# Patient Record
Sex: Female | Born: 1980 | Race: White | Hispanic: No | Marital: Single | Smoking: Never smoker
Health system: Southern US, Community
[De-identification: ages and names within clinical notes are randomized; demographics above are authoritative.]

## PROBLEM LIST (undated history)

## (undated) DIAGNOSIS — K219 Gastro-esophageal reflux disease without esophagitis: Secondary | ICD-10-CM

## (undated) DIAGNOSIS — E039 Hypothyroidism, unspecified: Secondary | ICD-10-CM

## (undated) DIAGNOSIS — F419 Anxiety disorder, unspecified: Secondary | ICD-10-CM

## (undated) DIAGNOSIS — E230 Hypopituitarism: Secondary | ICD-10-CM

## (undated) DIAGNOSIS — F79 Unspecified intellectual disabilities: Secondary | ICD-10-CM

## (undated) DIAGNOSIS — I8290 Acute embolism and thrombosis of unspecified vein: Secondary | ICD-10-CM

## (undated) DIAGNOSIS — D496 Neoplasm of unspecified behavior of brain: Secondary | ICD-10-CM

## (undated) DIAGNOSIS — R569 Unspecified convulsions: Secondary | ICD-10-CM

## (undated) DIAGNOSIS — E119 Type 2 diabetes mellitus without complications: Secondary | ICD-10-CM

## (undated) HISTORY — PX: SHUNT REPLACEMENT: SHX5403

---

## 2013-11-12 DIAGNOSIS — R443 Hallucinations, unspecified: Secondary | ICD-10-CM | POA: Insufficient documentation

## 2013-11-12 DIAGNOSIS — D496 Neoplasm of unspecified behavior of brain: Secondary | ICD-10-CM | POA: Insufficient documentation

## 2013-11-12 DIAGNOSIS — F79 Unspecified intellectual disabilities: Secondary | ICD-10-CM | POA: Insufficient documentation

## 2013-11-12 DIAGNOSIS — G40209 Localization-related (focal) (partial) symptomatic epilepsy and epileptic syndromes with complex partial seizures, not intractable, without status epilepticus: Secondary | ICD-10-CM | POA: Insufficient documentation

## 2015-01-30 ENCOUNTER — Emergency Department (HOSPITAL_COMMUNITY): Payer: Medicare Other

## 2015-01-30 ENCOUNTER — Inpatient Hospital Stay (HOSPITAL_COMMUNITY): Payer: Medicare Other

## 2015-01-30 ENCOUNTER — Encounter (HOSPITAL_COMMUNITY): Payer: Self-pay | Admitting: Emergency Medicine

## 2015-01-30 ENCOUNTER — Inpatient Hospital Stay (HOSPITAL_COMMUNITY)
Admission: EM | Admit: 2015-01-30 | Discharge: 2015-02-08 | DRG: 871 | Disposition: A | Discharge status: Skilled Nursing Facility | Payer: Medicare Other | Attending: Internal Medicine | Admitting: Internal Medicine

## 2015-01-30 DIAGNOSIS — E871 Hypo-osmolality and hyponatremia: Secondary | ICD-10-CM | POA: Diagnosis present

## 2015-01-30 DIAGNOSIS — F79 Unspecified intellectual disabilities: Secondary | ICD-10-CM | POA: Diagnosis present

## 2015-01-30 DIAGNOSIS — A419 Sepsis, unspecified organism: Secondary | ICD-10-CM | POA: Diagnosis present

## 2015-01-30 DIAGNOSIS — F419 Anxiety disorder, unspecified: Secondary | ICD-10-CM | POA: Diagnosis present

## 2015-01-30 DIAGNOSIS — E2740 Unspecified adrenocortical insufficiency: Secondary | ICD-10-CM

## 2015-01-30 DIAGNOSIS — E2749 Other adrenocortical insufficiency: Secondary | ICD-10-CM | POA: Diagnosis present

## 2015-01-30 DIAGNOSIS — D6489 Other specified anemias: Secondary | ICD-10-CM | POA: Diagnosis not present

## 2015-01-30 DIAGNOSIS — Z9289 Personal history of other medical treatment: Secondary | ICD-10-CM

## 2015-01-30 DIAGNOSIS — E039 Hypothyroidism, unspecified: Secondary | ICD-10-CM | POA: Diagnosis present

## 2015-01-30 DIAGNOSIS — N170 Acute kidney failure with tubular necrosis: Secondary | ICD-10-CM | POA: Diagnosis not present

## 2015-01-30 DIAGNOSIS — R4182 Altered mental status, unspecified: Secondary | ICD-10-CM | POA: Diagnosis not present

## 2015-01-30 DIAGNOSIS — L89621 Pressure ulcer of left heel, stage 1: Secondary | ICD-10-CM | POA: Diagnosis present

## 2015-01-30 DIAGNOSIS — I950 Idiopathic hypotension: Secondary | ICD-10-CM | POA: Diagnosis not present

## 2015-01-30 DIAGNOSIS — E230 Hypopituitarism: Secondary | ICD-10-CM | POA: Diagnosis present

## 2015-01-30 DIAGNOSIS — T68XXXA Hypothermia, initial encounter: Secondary | ICD-10-CM | POA: Diagnosis not present

## 2015-01-30 DIAGNOSIS — E559 Vitamin D deficiency, unspecified: Secondary | ICD-10-CM | POA: Diagnosis present

## 2015-01-30 DIAGNOSIS — D696 Thrombocytopenia, unspecified: Secondary | ICD-10-CM | POA: Diagnosis present

## 2015-01-30 DIAGNOSIS — W19XXXA Unspecified fall, initial encounter: Secondary | ICD-10-CM

## 2015-01-30 DIAGNOSIS — G9340 Encephalopathy, unspecified: Secondary | ICD-10-CM | POA: Diagnosis not present

## 2015-01-30 DIAGNOSIS — F329 Major depressive disorder, single episode, unspecified: Secondary | ICD-10-CM | POA: Diagnosis present

## 2015-01-30 DIAGNOSIS — E162 Hypoglycemia, unspecified: Secondary | ICD-10-CM | POA: Diagnosis present

## 2015-01-30 DIAGNOSIS — Z982 Presence of cerebrospinal fluid drainage device: Secondary | ICD-10-CM | POA: Diagnosis not present

## 2015-01-30 DIAGNOSIS — E272 Addisonian crisis: Secondary | ICD-10-CM | POA: Diagnosis not present

## 2015-01-30 DIAGNOSIS — J690 Pneumonitis due to inhalation of food and vomit: Secondary | ICD-10-CM | POA: Insufficient documentation

## 2015-01-30 DIAGNOSIS — D649 Anemia, unspecified: Secondary | ICD-10-CM | POA: Diagnosis not present

## 2015-01-30 DIAGNOSIS — E870 Hyperosmolality and hypernatremia: Secondary | ICD-10-CM | POA: Diagnosis not present

## 2015-01-30 DIAGNOSIS — E7220 Disorder of urea cycle metabolism, unspecified: Secondary | ICD-10-CM | POA: Insufficient documentation

## 2015-01-30 DIAGNOSIS — G9341 Metabolic encephalopathy: Secondary | ICD-10-CM | POA: Diagnosis present

## 2015-01-30 DIAGNOSIS — Z79899 Other long term (current) drug therapy: Secondary | ICD-10-CM | POA: Diagnosis not present

## 2015-01-30 DIAGNOSIS — E876 Hypokalemia: Secondary | ICD-10-CM | POA: Diagnosis not present

## 2015-01-30 DIAGNOSIS — N390 Urinary tract infection, site not specified: Secondary | ICD-10-CM

## 2015-01-30 DIAGNOSIS — D443 Neoplasm of uncertain behavior of pituitary gland: Secondary | ICD-10-CM | POA: Diagnosis not present

## 2015-01-30 DIAGNOSIS — R197 Diarrhea, unspecified: Secondary | ICD-10-CM | POA: Diagnosis not present

## 2015-01-30 DIAGNOSIS — R569 Unspecified convulsions: Secondary | ICD-10-CM

## 2015-01-30 DIAGNOSIS — D473 Essential (hemorrhagic) thrombocythemia: Secondary | ICD-10-CM | POA: Diagnosis not present

## 2015-01-30 DIAGNOSIS — J9601 Acute respiratory failure with hypoxia: Secondary | ICD-10-CM | POA: Diagnosis not present

## 2015-01-30 DIAGNOSIS — G40909 Epilepsy, unspecified, not intractable, without status epilepticus: Secondary | ICD-10-CM | POA: Diagnosis not present

## 2015-01-30 DIAGNOSIS — N179 Acute kidney failure, unspecified: Secondary | ICD-10-CM | POA: Diagnosis not present

## 2015-01-30 DIAGNOSIS — M25561 Pain in right knee: Secondary | ICD-10-CM | POA: Diagnosis not present

## 2015-01-30 DIAGNOSIS — E860 Dehydration: Secondary | ICD-10-CM | POA: Diagnosis present

## 2015-01-30 DIAGNOSIS — J9690 Respiratory failure, unspecified, unspecified whether with hypoxia or hypercapnia: Secondary | ICD-10-CM

## 2015-01-30 DIAGNOSIS — E878 Other disorders of electrolyte and fluid balance, not elsewhere classified: Secondary | ICD-10-CM | POA: Diagnosis present

## 2015-01-30 DIAGNOSIS — K7290 Hepatic failure, unspecified without coma: Secondary | ICD-10-CM

## 2015-01-30 DIAGNOSIS — H5500 Unspecified nystagmus: Secondary | ICD-10-CM | POA: Diagnosis present

## 2015-01-30 DIAGNOSIS — K219 Gastro-esophageal reflux disease without esophagitis: Secondary | ICD-10-CM | POA: Diagnosis present

## 2015-01-30 DIAGNOSIS — Z452 Encounter for adjustment and management of vascular access device: Secondary | ICD-10-CM

## 2015-01-30 DIAGNOSIS — R001 Bradycardia, unspecified: Secondary | ICD-10-CM | POA: Diagnosis present

## 2015-01-30 DIAGNOSIS — R579 Shock, unspecified: Secondary | ICD-10-CM | POA: Diagnosis present

## 2015-01-30 DIAGNOSIS — R0902 Hypoxemia: Secondary | ICD-10-CM

## 2015-01-30 DIAGNOSIS — L8990 Pressure ulcer of unspecified site, unspecified stage: Secondary | ICD-10-CM | POA: Diagnosis present

## 2015-01-30 HISTORY — DX: Hypothyroidism, unspecified: E03.9

## 2015-01-30 HISTORY — DX: Unspecified intellectual disabilities: F79

## 2015-01-30 HISTORY — DX: Unspecified convulsions: R56.9

## 2015-01-30 HISTORY — DX: Anxiety disorder, unspecified: F41.9

## 2015-01-30 HISTORY — DX: Hypopituitarism: E23.0

## 2015-01-30 HISTORY — DX: Neoplasm of unspecified behavior of brain: D49.6

## 2015-01-30 LAB — COMPREHENSIVE METABOLIC PANEL
ALK PHOS: 81 U/L (ref 38–126)
ALT: 23 U/L (ref 14–54)
ANION GAP: 10 (ref 5–15)
AST: 27 U/L (ref 15–41)
Albumin: 3 g/dL — ABNORMAL LOW (ref 3.5–5.0)
BILIRUBIN TOTAL: 0.4 mg/dL (ref 0.3–1.2)
BUN: 14 mg/dL (ref 6–20)
CO2: 29 mmol/L (ref 22–32)
Calcium: 8.7 mg/dL — ABNORMAL LOW (ref 8.9–10.3)
Chloride: 92 mmol/L — ABNORMAL LOW (ref 101–111)
Creatinine, Ser: 0.45 mg/dL (ref 0.44–1.00)
GLUCOSE: 115 mg/dL — AB (ref 65–99)
POTASSIUM: 4.2 mmol/L (ref 3.5–5.1)
Sodium: 131 mmol/L — ABNORMAL LOW (ref 135–145)
Total Protein: 5.8 g/dL — ABNORMAL LOW (ref 6.5–8.1)

## 2015-01-30 LAB — CBC WITH DIFFERENTIAL/PLATELET
Basophils Absolute: 0 10*3/uL (ref 0.0–0.1)
Basophils Relative: 0 % (ref 0–1)
Eosinophils Absolute: 0 10*3/uL (ref 0.0–0.7)
Eosinophils Relative: 1 % (ref 0–5)
HCT: 35.6 % — ABNORMAL LOW (ref 36.0–46.0)
Hemoglobin: 12.4 g/dL (ref 12.0–15.0)
LYMPHS PCT: 61 % — AB (ref 12–46)
Lymphs Abs: 2 10*3/uL (ref 0.7–4.0)
MCH: 31 pg (ref 26.0–34.0)
MCHC: 34.8 g/dL (ref 30.0–36.0)
MCV: 89 fL (ref 78.0–100.0)
Monocytes Absolute: 0.1 10*3/uL (ref 0.1–1.0)
Monocytes Relative: 3 % (ref 3–12)
NEUTROS ABS: 1.2 10*3/uL — AB (ref 1.7–7.7)
NEUTROS PCT: 35 % — AB (ref 43–77)
Platelets: UNDETERMINED 10*3/uL (ref 150–400)
RBC: 4 MIL/uL (ref 3.87–5.11)
RDW: 15.9 % — AB (ref 11.5–15.5)
WBC: 3.3 10*3/uL — ABNORMAL LOW (ref 4.0–10.5)

## 2015-01-30 LAB — URINALYSIS, ROUTINE W REFLEX MICROSCOPIC
Bilirubin Urine: NEGATIVE
GLUCOSE, UA: 500 mg/dL — AB
KETONES UR: NEGATIVE mg/dL
Nitrite: NEGATIVE
PH: 7.5 (ref 5.0–8.0)
PROTEIN: NEGATIVE mg/dL
SPECIFIC GRAVITY, URINE: 1.016 (ref 1.005–1.030)
UROBILINOGEN UA: 0.2 mg/dL (ref 0.0–1.0)

## 2015-01-30 LAB — I-STAT ARTERIAL BLOOD GAS, ED
Acid-Base Excess: 5 mmol/L — ABNORMAL HIGH (ref 0.0–2.0)
Bicarbonate: 32.3 mEq/L — ABNORMAL HIGH (ref 20.0–24.0)
O2 Saturation: 68 %
PCO2 ART: 49.1 mmHg — AB (ref 35.0–45.0)
PH ART: 7.404 (ref 7.350–7.450)
PO2 ART: 28 mmHg — AB (ref 80.0–100.0)
Patient temperature: 90.2
TCO2: 34 mmol/L (ref 0–100)

## 2015-01-30 LAB — CBC
HCT: 29.1 % — ABNORMAL LOW (ref 36.0–46.0)
HEMOGLOBIN: 10.3 g/dL — AB (ref 12.0–15.0)
MCH: 30.8 pg (ref 26.0–34.0)
MCHC: 35.4 g/dL (ref 30.0–36.0)
MCV: 87.1 fL (ref 78.0–100.0)
Platelets: 117 10*3/uL — ABNORMAL LOW (ref 150–400)
RBC: 3.34 MIL/uL — ABNORMAL LOW (ref 3.87–5.11)
RDW: 15.7 % — ABNORMAL HIGH (ref 11.5–15.5)
WBC: 2.3 10*3/uL — ABNORMAL LOW (ref 4.0–10.5)

## 2015-01-30 LAB — CBG MONITORING, ED
GLUCOSE-CAPILLARY: 153 mg/dL — AB (ref 65–99)
Glucose-Capillary: 59 mg/dL — ABNORMAL LOW (ref 65–99)
Glucose-Capillary: 84 mg/dL (ref 65–99)

## 2015-01-30 LAB — GLUCOSE, CAPILLARY
GLUCOSE-CAPILLARY: 165 mg/dL — AB (ref 65–99)
Glucose-Capillary: 55 mg/dL — ABNORMAL LOW (ref 65–99)
Glucose-Capillary: 186 mg/dL — ABNORMAL HIGH (ref 65–99)
Glucose-Capillary: 212 mg/dL — ABNORMAL HIGH (ref 65–99)
Glucose-Capillary: 146 mg/dL — ABNORMAL HIGH (ref 65–99)

## 2015-01-30 LAB — VITAMIN B12: Vitamin B-12: 893 pg/mL (ref 180–914)

## 2015-01-30 LAB — I-STAT VENOUS BLOOD GAS, ED
ACID-BASE EXCESS: 5 mmol/L — AB (ref 0.0–2.0)
Bicarbonate: 32.9 mEq/L — ABNORMAL HIGH (ref 20.0–24.0)
O2 SAT: 75 %
PO2 VEN: 44 mmHg (ref 30.0–45.0)
TCO2: 35 mmol/L (ref 0–100)
pCO2, Ven: 61.6 mmHg — ABNORMAL HIGH (ref 45.0–50.0)
pH, Ven: 7.336 — ABNORMAL HIGH (ref 7.250–7.300)

## 2015-01-30 LAB — FOLATE: Folate: 5.8 ng/mL — ABNORMAL LOW (ref >5.9)

## 2015-01-30 LAB — I-STAT CHEM 8, ED
BUN: 18 mg/dL (ref 6–20)
Calcium, Ion: 1.13 mmol/L (ref 1.12–1.23)
Chloride: 91 mmol/L — ABNORMAL LOW (ref 101–111)
Creatinine, Ser: 0.6 mg/dL (ref 0.44–1.00)
Glucose, Bld: 127 mg/dL — ABNORMAL HIGH (ref 65–99)
HEMATOCRIT: 40 % (ref 36.0–46.0)
HEMOGLOBIN: 13.6 g/dL (ref 12.0–15.0)
Potassium: 4.1 mmol/L (ref 3.5–5.1)
SODIUM: 129 mmol/L — AB (ref 135–145)
TCO2: 28 mmol/L (ref 0–100)

## 2015-01-30 LAB — URINE MICROSCOPIC-ADD ON

## 2015-01-30 LAB — VALPROIC ACID LEVEL: Valproic Acid Lvl: 49 ug/mL — ABNORMAL LOW (ref 50.0–100.0)

## 2015-01-30 LAB — PHENYTOIN LEVEL, TOTAL: Phenytoin Lvl: 14.1 ug/mL (ref 10.0–20.0)

## 2015-01-30 LAB — TSH: TSH: 0.153 u[IU]/mL — ABNORMAL LOW (ref 0.350–4.500)

## 2015-01-30 LAB — MRSA PCR SCREENING: MRSA BY PCR: NEGATIVE

## 2015-01-30 LAB — LACTIC ACID, PLASMA: LACTIC ACID, VENOUS: 1 mmol/L (ref 0.5–2.0)

## 2015-01-30 LAB — TECHNOLOGIST SMEAR REVIEW

## 2015-01-30 LAB — I-STAT CG4 LACTIC ACID, ED: Lactic Acid, Venous: 2.94 mmol/L (ref 0.5–2.0)

## 2015-01-30 LAB — MAGNESIUM: MAGNESIUM: 1.4 mg/dL — AB (ref 1.7–2.4)

## 2015-01-30 LAB — PHOSPHORUS: PHOSPHORUS: 2.6 mg/dL (ref 2.5–4.6)

## 2015-01-30 LAB — PROTIME-INR
INR: 1.23 (ref 0.00–1.49)
Prothrombin Time: 15.7 seconds — ABNORMAL HIGH (ref 11.6–15.2)

## 2015-01-30 LAB — T4, FREE: FREE T4: 0.4 ng/dL — AB (ref 0.61–1.12)

## 2015-01-30 MED ORDER — KCL IN DEXTROSE-NACL 20-5-0.45 MEQ/L-%-% IV SOLN
Freq: Once | INTRAVENOUS | Status: DC
  Filled 2015-01-30: qty 1000

## 2015-01-30 MED ORDER — PIPERACILLIN-TAZOBACTAM 3.375 G IVPB 30 MIN
3.3750 g | Freq: Once | INTRAVENOUS | Status: AC
  Administered 2015-01-30: 3.375 g via INTRAVENOUS
  Filled 2015-01-30: qty 50

## 2015-01-30 MED ORDER — SODIUM CHLORIDE 0.9 % IV BOLUS (SEPSIS)
500.0000 mL | Freq: Once | INTRAVENOUS | Status: AC
  Administered 2015-01-30: 500 mL via INTRAVENOUS

## 2015-01-30 MED ORDER — FOLIC ACID 5 MG/ML IJ SOLN
1.0000 mg | Freq: Every day | INTRAMUSCULAR | Status: DC
  Administered 2015-01-30 – 2015-02-04 (×6): 1 mg via INTRAVENOUS
  Filled 2015-01-30 (×7): qty 0.2

## 2015-01-30 MED ORDER — DESMOPRESSIN ACETATE SPRAY 0.01 % NA SOLN
10.0000 ug | Freq: Every day | NASAL | Status: DC
  Administered 2015-02-02 – 2015-02-04 (×3): 10 ug via NASAL
  Filled 2015-01-30 (×3): qty 5

## 2015-01-30 MED ORDER — SODIUM CHLORIDE 0.9 % IV BOLUS (SEPSIS)
1000.0000 mL | Freq: Once | INTRAVENOUS | Status: AC
  Administered 2015-01-30: 1000 mL via INTRAVENOUS

## 2015-01-30 MED ORDER — ENOXAPARIN SODIUM 40 MG/0.4ML ~~LOC~~ SOLN
40.0000 mg | SUBCUTANEOUS | Status: DC
  Administered 2015-01-30 – 2015-01-31 (×2): 40 mg via SUBCUTANEOUS
  Filled 2015-01-30 (×3): qty 0.4

## 2015-01-30 MED ORDER — DEXTROSE 10 % IV SOLN
INTRAVENOUS | Status: DC
  Administered 2015-01-30: 17:00:00 via INTRAVENOUS

## 2015-01-30 MED ORDER — HYDROCORTISONE NA SUCCINATE PF 100 MG IJ SOLR
20.0000 mg | Freq: Three times a day (TID) | INTRAMUSCULAR | Status: DC
  Administered 2015-01-30 – 2015-01-31 (×3): 20 mg via INTRAVENOUS
  Filled 2015-01-30 (×6): qty 0.4

## 2015-01-30 MED ORDER — DEXTROSE-NACL 5-0.9 % IV SOLN
INTRAVENOUS | Status: DC
  Administered 2015-01-30: 16:00:00 via INTRAVENOUS

## 2015-01-30 MED ORDER — VANCOMYCIN HCL 10 G IV SOLR
2000.0000 mg | Freq: Once | INTRAVENOUS | Status: AC
  Administered 2015-01-30: 2000 mg via INTRAVENOUS
  Filled 2015-01-30: qty 2000

## 2015-01-30 MED ORDER — PHENYTOIN SODIUM 50 MG/ML IJ SOLN
75.0000 mg | Freq: Two times a day (BID) | INTRAMUSCULAR | Status: DC
  Administered 2015-01-30 – 2015-02-04 (×9): 75 mg via INTRAVENOUS
  Filled 2015-01-30 (×11): qty 1.5

## 2015-01-30 MED ORDER — DEXTROSE 50 % IV SOLN
INTRAVENOUS | Status: AC
  Filled 2015-01-30: qty 50

## 2015-01-30 MED ORDER — DEXTROSE-NACL 5-0.45 % IV SOLN
INTRAVENOUS | Status: DC
  Administered 2015-01-30: 12:00:00 via INTRAVENOUS

## 2015-01-30 MED ORDER — LEVOTHYROXINE SODIUM 100 MCG IV SOLR
50.0000 ug | Freq: Every day | INTRAVENOUS | Status: DC
  Administered 2015-01-31 – 2015-02-01 (×2): 50 ug via INTRAVENOUS
  Filled 2015-01-30 (×3): qty 5

## 2015-01-30 MED ORDER — MAGNESIUM SULFATE 2 GM/50ML IV SOLN
2.0000 g | Freq: Once | INTRAVENOUS | Status: AC
  Administered 2015-01-30: 2 g via INTRAVENOUS
  Filled 2015-01-30: qty 50

## 2015-01-30 MED ORDER — SODIUM CHLORIDE 0.9 % IV SOLN
INTRAVENOUS | Status: DC
  Administered 2015-01-30: 18:00:00 via INTRAVENOUS

## 2015-01-30 MED ORDER — DEXTROSE 50 % IV SOLN
1.0000 | Freq: Once | INTRAVENOUS | Status: AC
  Administered 2015-01-30 – 2015-01-31 (×2): 50 mL via INTRAVENOUS

## 2015-01-30 MED ORDER — SODIUM CHLORIDE 0.9 % IV BOLUS (SEPSIS)
250.0000 mL | Freq: Once | INTRAVENOUS | Status: AC
  Administered 2015-01-30: 250 mL via INTRAVENOUS

## 2015-01-30 MED ORDER — LEVOTHYROXINE SODIUM 100 MCG IV SOLR
37.5000 ug | Freq: Every day | INTRAVENOUS | Status: DC
  Administered 2015-01-30: 37.5 ug via INTRAVENOUS
  Filled 2015-01-30 (×2): qty 5

## 2015-01-30 MED ORDER — ALBUTEROL SULFATE (2.5 MG/3ML) 0.083% IN NEBU: 2.5000 mg | INHALATION_SOLUTION | RESPIRATORY_TRACT | Status: DC | PRN

## 2015-01-30 MED ORDER — DEXTROSE 50 % IV SOLN
INTRAVENOUS | Status: AC
  Administered 2015-01-30: 50 mL
  Filled 2015-01-30: qty 50

## 2015-01-30 MED ORDER — SODIUM CHLORIDE 0.9 % IJ SOLN
3.0000 mL | Freq: Two times a day (BID) | INTRAMUSCULAR | Status: DC
  Administered 2015-01-31 – 2015-02-08 (×12): 3 mL via INTRAVENOUS

## 2015-01-30 MED ORDER — VANCOMYCIN HCL IN DEXTROSE 1-5 GM/200ML-% IV SOLN
1000.0000 mg | Freq: Three times a day (TID) | INTRAVENOUS | Status: DC
  Administered 2015-01-30 – 2015-01-31 (×2): 1000 mg via INTRAVENOUS
  Filled 2015-01-30 (×4): qty 200

## 2015-01-30 MED ORDER — VALPROATE SODIUM 500 MG/5ML IV SOLN
500.0000 mg | Freq: Four times a day (QID) | INTRAVENOUS | Status: DC
  Administered 2015-01-31 – 2015-02-01 (×7): 500 mg via INTRAVENOUS
  Filled 2015-01-30 (×9): qty 5

## 2015-01-30 NOTE — Progress Notes (Signed)
Physician notified: Hulen Luster At: 1705  Regarding:  Please consider central line, IV team unable to get secondary line. Meds incompatible with IVF.  Awaiting return response.   Returned Response at: 1908  Order(s): Will get consult for central line.

## 2015-01-30 NOTE — Progress Notes (Signed)
Attempted to obtain ABG on patient.  Obtained venous blood during sample run.  MD aware.  Will continue to monitor patient sats.

## 2015-01-30 NOTE — Progress Notes (Signed)
Hypoglycemic Event  CBG: 55  Treatment: D50 IV 50 mL  Symptoms: None  Follow-up CBG: Time:1645 CBG Result: 186  Possible Reasons for Event: Inadequate meal intake  Comments/MD notified:Ahmed PY1 @ 1624, CBG 55--BP 83/49, please address as this is 3 amp D50 since EMS. awaiting return response. 1630- Med student at bedside with pharmacist. Will continue to monitor.     Pricilla Holm P  Remember to initiate Hypoglycemia Order Set & complete

## 2015-01-30 NOTE — ED Provider Notes (Signed)
CSN: [id number]     Arrival date & time 01/30/15  1049 History   First MD Initiated Contact with Patient 01/30/15 1055     Chief Complaint  Patient presents with  . Altered Mental Status     (Consider location/radiation/quality/duration/timing/severity/associated sxs/prior Treatment) Patient is a 34 y.o. female presenting with altered mental status.  Altered Mental Status Presenting symptoms: partial responsiveness   Severity:  Moderate Most recent episode:  Today Duration: unknown. Timing:  Constant Progression:  Resolved Chronicity:  New Context: nursing home resident and recent infection (PNA, admitted to [facility] several weeks ago)   Context: taking medications as prescribed, not a recent change in medication and not a recent illness   Associated symptoms: no abdominal pain, normal movement, no agitation, no difficulty breathing, no fever, no headaches, no light-headedness, no nausea, no palpitations, no rash, no vomiting and no weakness     Past Medical History  Diagnosis Date  . Mental retardation   . Hypothyroid   . Anxiety   . Seizures   . Pituitary insufficiency   . Brain neoplasm    Past Surgical History  Procedure Laterality Date  . Shunt replacement      Brain   History reviewed. No pertinent family history. History  Substance Use Topics  . Smoking status: Never Smoker   . Smokeless tobacco: Not on file  . Alcohol Use: No   OB History    No data available     Review of Systems  Constitutional: Negative for fever, chills, appetite change and fatigue.  HENT: Negative for congestion, ear pain, facial swelling, mouth sores and sore throat.   Eyes: Negative for visual disturbance.  Respiratory: Negative for cough, chest tightness and shortness of breath.   Cardiovascular: Negative for chest pain and palpitations.  Gastrointestinal: Negative for nausea, vomiting, abdominal pain, diarrhea and blood in stool.  Endocrine: Negative for cold intolerance and  heat intolerance.  Genitourinary: Negative for frequency, decreased urine volume and difficulty urinating.  Musculoskeletal: Negative for back pain and neck stiffness.  Skin: Negative for rash.  Neurological: Negative for dizziness, weakness, light-headedness and headaches.  Psychiatric/Behavioral: Negative for agitation.  All other systems reviewed and are negative.     Allergies  Review of patient's allergies indicates no known allergies.  Home Medications   Prior to Admission medications   Medication Sig Start Date End Date Taking? Authorizing Provider  albuterol (PROVENTIL HFA;VENTOLIN HFA) 108 (90 BASE) MCG/ACT inhaler Inhale 1-2 puffs into the lungs every 6 (six) hours as needed for wheezing or shortness of breath.   Yes Historical Provider, MD  Calcium Carb-Cholecalciferol (CALCIUM 600 + D) 600-200 MG-UNIT TABS Take 1 tablet by mouth daily.   Yes Historical Provider, MD  desmopressin (DDAVP) 0.1 MG tablet Take 0.1 mg by mouth daily.   Yes Historical Provider, MD  divalproex (DEPAKOTE) 500 MG DR tablet Take 500 mg by mouth 4 (four) times daily.   Yes Historical Provider, MD  hydrocortisone (CORTEF) 10 MG tablet Take 10 mg by mouth 3 (three) times daily.   Yes Historical Provider, MD  levothyroxine (SYNTHROID) 75 MCG tablet Take 75 mcg by mouth daily before breakfast.   Yes Historical Provider, MD  LORazepam (ATIVAN) 1 MG tablet Take 0.5-1 mg by mouth 3 (three) times daily. 1mg  at 8am and 8pm, 0.5mg  at 1pm   Yes Historical Provider, MD  magnesium oxide (MAG-OX) 400 MG tablet Take 400 mg by mouth 2 (two) times daily.   Yes Historical Provider, MD  Omega-3  Fatty Acids (FISH OIL) 1000 MG CAPS Take 1,000 mg by mouth 2 (two) times daily.   Yes Historical Provider, MD  pantoprazole (PROTONIX) 40 MG tablet Take 40 mg by mouth daily.   Yes Historical Provider, MD  PARoxetine (PAXIL) 20 MG tablet Take 20 mg by mouth daily.   Yes Historical Provider, MD  phenytoin (DILANTIN) 50 MG tablet Chew  75 mg by mouth 2 (two) times daily.   Yes Historical Provider, MD  Vitamin D, Ergocalciferol, (DRISDOL) 50000 UNITS CAPS capsule Take 50,000 Units by mouth every 7 (seven) days.   Yes Historical Provider, MD   BP 89/65 mmHg  Pulse 65  Temp(Src) 93.6 F (34.2 C) (Axillary)  Resp 22  Ht 5\' 4"  (1.626 m)  Wt 194 lb 3.6 oz (88.1 kg)  BMI 33.32 kg/m2  SpO2 100%  LMP  (LMP Unknown) Physical Exam  Constitutional: She is oriented to person, place, and time. She appears well-developed and well-nourished. No distress.  HENT:  Head: Normocephalic and atraumatic.  Right Ear: External ear normal.  Left Ear: External ear normal.  Nose: Nose normal.  Eyes: Conjunctivae and EOM are normal. Pupils are equal, round, and reactive to light. Right eye exhibits no discharge. Left eye exhibits no discharge. No scleral icterus.  Neck: Normal range of motion. Neck supple.  Cardiovascular: Normal rate, regular rhythm and normal heart sounds.  Exam reveals no gallop and no friction rub.   No murmur heard. Pulmonary/Chest: Effort normal and breath sounds normal. No stridor. No respiratory distress. She has no wheezes.  Abdominal: Soft. She exhibits no distension. There is no tenderness.  Musculoskeletal: She exhibits no edema or tenderness.  Neurological: She is alert and oriented to person, place, and time. GCS eye subscore is 4. GCS verbal subscore is 5. GCS motor subscore is 6.  Follows command; otherwise difficult to assess   Skin: Skin is warm and dry. No rash noted. She is not diaphoretic. No erythema.  Psychiatric: She has a normal mood and affect.    ED Course  Procedures (including critical care time) Labs Review Labs Reviewed  COMPREHENSIVE METABOLIC PANEL - Abnormal; Notable for the following:    Sodium 131 (*)    Chloride 92 (*)    Glucose, Bld 115 (*)    Calcium 8.7 (*)    Total Protein 5.8 (*)    Albumin 3.0 (*)    All other components within normal limits  CBC WITH  DIFFERENTIAL/PLATELET - Abnormal; Notable for the following:    WBC 3.3 (*)    HCT 35.6 (*)    RDW 15.9 (*)    Neutrophils Relative % 35 (*)    Lymphocytes Relative 61 (*)    Neutro Abs 1.2 (*)    All other components within normal limits  URINALYSIS, ROUTINE W REFLEX MICROSCOPIC (NOT AT [facility]) - Abnormal; Notable for the following:    APPearance CLOUDY (*)    Glucose, UA 500 (*)    Hgb urine dipstick MODERATE (*)    Leukocytes, UA SMALL (*)    All other components within normal limits  URINE MICROSCOPIC-ADD ON - Abnormal; Notable for the following:    Squamous Epithelial / LPF FEW (*)    Bacteria, UA FEW (*)    Casts HYALINE CASTS (*)    All other components within normal limits  I-STAT CG4 LACTIC ACID, ED - Abnormal; Notable for the following:    Lactic Acid, Venous 2.94 (*)    All other components within normal limits  I-STAT VENOUS BLOOD GAS, ED - Abnormal; Notable for the following:    pH, Ven 7.336 (*)    pCO2, Ven 61.6 (*)    Bicarbonate 32.9 (*)    Acid-Base Excess 5.0 (*)    All other components within normal limits  I-STAT CHEM 8, ED - Abnormal; Notable for the following:    Sodium 129 (*)    Chloride 91 (*)    Glucose, Bld 127 (*)    All other components within normal limits  CBG MONITORING, ED - Abnormal; Notable for the following:    Glucose-Capillary 59 (*)    All other components within normal limits  I-STAT ARTERIAL BLOOD GAS, ED - Abnormal; Notable for the following:    pCO2 arterial 49.1 (*)    pO2, Arterial 28.0 (*)    Bicarbonate 32.3 (*)    Acid-Base Excess 5.0 (*)    All other components within normal limits  CBG MONITORING, ED - Abnormal; Notable for the following:    Glucose-Capillary 153 (*)    All other components within normal limits  CULTURE, BLOOD (ROUTINE X 2)  CULTURE, BLOOD (ROUTINE X 2)  URINE CULTURE  TSH  LACTIC ACID, PLASMA  CBG MONITORING, ED    Imaging Review Dg Chest Port 1 View  01/30/2015   CLINICAL DATA:  Altered mental  status, mental retardation  EXAM: PORTABLE CHEST - 1 VIEW  COMPARISON:  None.  FINDINGS: Cardiomediastinal silhouette is unremarkable. There is poor inspiration. No acute infiltrate or pleural effusion. No pulmonary edema. Mild basilar atelectasis.  IMPRESSION: No active disease.   Electronically Signed   By: Lahoma Crocker M.D.   On: 01/30/2015 12:09     EKG Interpretation None      MDM   34 year old female with with a history listed above presents with decreased interaction from nursing facility noted to be hypothermic and hypotensive as well as hypoglycemic on EMS arrival. Of note patient was recently treated for pneumonia at [facility] and discharged back to the nursing facility on Levaquin. Last reported normal was yesterday evening. On arrival patient was hypothermic with temperature of 90.2, bradycardic heart rate in the 50s, hypotensive with systolics in the 85F, and hypoglycemic with a CBG in the 50s. Patient given D50 and placed on D5 half normal saline infusion which improved her blood sugar and blood pressure. Presentation concerning for infectious etiology. Full septic workup obtained including blood cultures. Patient placed on broad-spectrum anabiotic coverage. And will be admitted to medicine for further management and workup.  Patient seen in conjunction with Dr. Alvino Chapel.  Sibyl Parr, M.D. Resident Final diagnoses:  Acute encephalopathy  Hypoxia        Addison Lank, MD 01/30/15 [place], MD 02/02/15 [phone]

## 2015-01-30 NOTE — Progress Notes (Signed)
Previous RN note states I was paged at 1705 and returned call at 50. This is incorrect.  I received the page around 1905.   I did call PCCM for central line.   Dr. Genene Churn.

## 2015-01-30 NOTE — Progress Notes (Signed)
Notified md on call regarding low bp 84/60 will continue to monitor, notify md on call if drops below 80's

## 2015-01-30 NOTE — Consult Note (Addendum)
[place] for :   Transition from oral DDAVP to Nasal Spray;  Dilantin Indication:  Adrenal Insufficiency ;  History of Seizures [patient on Dilantin PTA]  Hospital Problems: Active Problems:   Sepsis  Allergies: No Known Allergies  Patient Measurements: Height: 5\' 4"  (162.6 cm) Weight: 194 lb 3.6 oz (88.1 kg) IBW/kg (Calculated) : 54.7 Vital Signs: Temp: 93.6 F (34.2 C) (06/13 1500) Temp Source: Axillary (06/13 1500) BP: 112/36 mmHg (06/13 1514) Pulse Rate: 61 (06/13 1514)  Labs: BMP  Recent Labs  01/30/15 1211 01/30/15 1219  NA 131* 129*  K 4.2 4.1  CL 92* 91*  CO2 29  --   GLUCOSE 115* 127*  BUN 14 18  CREATININE 0.45 0.60  CALCIUM 8.7*  --   GFRNONAA >60  --    Estimated Creatinine Clearance: 106.5 mL/min (by C-G formula based on Cr of 0.6).   CBC  Lab 01/30/15 1230  WBC 3.3*  RBC 4.00  HGB 12.4  HCT 35.6*  PLT PLATELET CLUMPS NOTED ON SMEAR, UNABLE TO ESTIMATE  MCV 89.0  MCH 31.0  MCHC 34.8  RDW 15.9*  LYMPHSABS 2.0  MONOABS 0.1  EOSABS 0.0  BASOSABS 0.0    HEMOGLOBIN A1C No results found for: HGBA1C, MPG  TSH Pending  Hepatic Function Panel   01/30/15 1211  PROT 5.8*  ALBUMIN 3.0*  AST 27  ALT 23  ALKPHOS 81  BILITOT 0.4    Recent Labs Lab 01/30/15 1219 01/30/15 1230  WBC  --  3.3*  LATICACIDVEN 2.94*  --      Dg Chest Port 1 View  01/30/2015   CLINICAL DATA:  Altered mental status, mental retardation  EXAM: PORTABLE CHEST - 1 VIEW  COMPARISON:  None.  FINDINGS: Cardiomediastinal silhouette is unremarkable. There is poor inspiration. No acute infiltrate or pleural effusion. No pulmonary edema. Mild basilar atelectasis.  IMPRESSION: No active disease.   Electronically Signed   By: Lahoma Crocker M.D.   On: 01/30/2015 12:09   Estimated Creatinine Clearance: 106.5 mL/min (by C-G formula based on Cr of 0.6).  Microbiology: No results found for this or any previous visit (from the past 720  hour(s)).  Medical/Surgical History: Past Medical History  Diagnosis Date  . Mental retardation   . Hypothyroid   . Anxiety   . Seizures   . Pituitary insufficiency   . Brain neoplasm    Past Surgical History  Procedure Laterality Date  . Shunt replacement      Brain    Current Medication[s] Include: Prior to Admission: Prescriptions prior to admission  Medication Sig Dispense Refill Last Dose  . albuterol (PROVENTIL HFA;VENTOLIN HFA) 108 (90 BASE) MCG/ACT inhaler Inhale 1-2 puffs into the lungs every 6 (six) hours as needed for wheezing or shortness of breath.   unknown  . Calcium Carb-Cholecalciferol (CALCIUM 600 + D) 600-200 MG-UNIT TABS Take 1 tablet by mouth daily.   01/29/2015 at Unknown time  . desmopressin (DDAVP) 0.1 MG tablet Take 0.1 mg by mouth daily.   01/29/2015 at Unknown time  . divalproex (DEPAKOTE) 500 MG DR tablet Take 500 mg by mouth 4 (four) times daily.   01/29/2015 at Unknown time  . hydrocortisone (CORTEF) 10 MG tablet Take 10 mg by mouth 3 (three) times daily.   01/29/2015 at Unknown time  . levothyroxine (SYNTHROID) 75 MCG tablet Take 75 mcg by mouth daily before breakfast.   01/29/2015 at Unknown time  . LORazepam (ATIVAN) 1 MG  tablet Take 0.5-1 mg by mouth 3 (three) times daily. 1mg  at 8am and 8pm, 0.5mg  at 1pm   01/29/2015 at Unknown time  . magnesium oxide (MAG-OX) 400 MG tablet Take 400 mg by mouth 2 (two) times daily.   01/29/2015 at Unknown time  . Omega-3 Fatty Acids (FISH OIL) 1000 MG CAPS Take 1,000 mg by mouth 2 (two) times daily.   01/29/2015 at Unknown time  . pantoprazole (PROTONIX) 40 MG tablet Take 40 mg by mouth daily.   01/29/2015 at Unknown time  . PARoxetine (PAXIL) 20 MG tablet Take 20 mg by mouth daily.   01/29/2015 at Unknown time  . phenytoin (DILANTIN) 50 MG tablet Chew 75 mg by mouth 2 (two) times daily.   01/29/2015 at Unknown time  . Vitamin D, Ergocalciferol, (DRISDOL) 50000 UNITS CAPS capsule Take 50,000 Units by mouth every 7 (seven)  days.   01/21/2015   Scheduled:  Scheduled:  . enoxaparin (LOVENOX) injection  40 mg Subcutaneous Q24H  . hydrocortisone sod succinate (SOLU-CORTEF) inj  20 mg Intravenous Q8H  . levothyroxine  37.5 mcg Intravenous QAC breakfast  . sodium chloride  500 mL Intravenous Once  . sodium chloride  3 mL Intravenous Q12H  . valproate sodium  500 mg Intravenous 4 times per day   Antibiotic[s]: Anti-infectives    Start     Dose/Rate Route Frequency Ordered Stop   01/30/15 1300  vancomycin (VANCOCIN) 2,000 mg in sodium chloride 0.9 % 500 mL IVPB     2,000 mg 250 mL/hr over 120 Minutes Intravenous  Once 01/30/15 1252 01/30/15 1606   01/30/15 1300  piperacillin-tazobactam (ZOSYN) IVPB 3.375 g     3.375 g 100 mL/hr over 30 Minutes Intravenous  Once 01/30/15 1252 01/30/15 1400     Assessment:  34 yo female with hx of Mental retardation, hypothyroidism, seizures, brain neoplasm ([facility]) comes from nursing home ([place]), with AMS and also concerning for sepsis  Pharmacy asked to dose IV Dilantin, change oral DDAVP to Nasal Spray.  Patient received single doses of Vancomycin and Zosyn for suspected sepsis.   Previous treated for PNA by Levaquin at SNF.   Goal of Therapy:  DDAVP dosed via Nasal spray vs oral DDAVP Prior to Admission   Dilantin transitioned to IV based on Prior to Admission oral dosing and baseline total Dilantin level.  Plan:  1. Begin DDAVP Nasal Spray 10 mcg/daily [1 spray] 2. Stat Phenytoin level, then begin IV Dilantin based on levels. 3. Follow-up Phenytoin levels as indicated. 4. Follow-up DDAVP response.  Medical to adjust dosage as needed.  Jermiya Reichl, Craig Guess,  Pharm.D,    6/13/20164:38 PM  ADDENDUM:   Baseline Phenytoin level 14.1 mcg/ml.  Patient has not received any doses today.  Corrected Phenytoin level estimated as 20.1 mcg/ml based on Albumin of 3.  Goal for corrected Total Phenytoin 10 - 20 mcg/ml.  PLAN: 1. Continue Prior to Admission  dose of  Dilantin 75 mg IV q 12 hours.  [Patient taking oral at [facility].  No immediate adjustments required]. 2. Follow-up Phenytoin levels in 5-7 days if needed.  Marthenia Rolling,  Pharm.D   01/30/2015,  6:44 PM

## 2015-01-30 NOTE — ED Notes (Signed)
POCT CBG resulted 153; Holly, RN aware

## 2015-01-30 NOTE — H&P (Signed)
Date: 01/30/2015               Patient Name:  Margaret Morgan MRN: [id number]  DOB: 19-Nov-1980 Age / Sex: 34 y.o., female   PCP: No primary care provider on file.         Medical Service: Internal Medicine Teaching Service         Attending Physician: Dr. Aldine Contes, MD    First Contact: Dr. Genene Churn Pager: [phone]  Second Contact: Dr. Naaman Plummer Pager: [phone]       After Hours (After 5p/  First Contact Pager: [phone]  weekends / holidays): Second Contact Pager: [phone]   Chief Complaint: AMS, sepsis  History of Present Illness:   34 yo female with hx of Mental retardation, hypothyroidism, seizures, brain neoplasm ([facility]) comes from nursing home ([place]), with AMS and also concerning for sepsis.  Recent admission to [facility] earlier this month or towards end of last month. Was discharged to nursing home with Levaquin to treat bilateral pneumonia. Finished levaquin on 01/23/15. Per nursing home staff, she has been less interactive than her baseline after being discharged from the hospital.   Patient presented with Temp 90.2, BP 80-90 (baseline 100), P 50's. CXR poor quality and poor effort but read as normal. UA normal. Glucose 59 then 150 after 1 amp d50. Received 2L fluid boluses and started on vanc+zosyn for sepsis. Lactic acid 2.94. WBC 3.3.   She is not able to provide hx. Moves ext with request, opens eyes when asked. Does not talk.   Meds: Current Facility-Administered Medications  Medication Dose Route Frequency Provider Last Rate Last Dose  . dextrose 5 %-0.45 % sodium chloride infusion   Intravenous Continuous Addison Lank, MD 100 mL/hr at 01/30/15 1148    . dextrose 5 %-0.9 % sodium chloride infusion   Intravenous Continuous Marjan Rabbani, MD      . enoxaparin (LOVENOX) injection 40 mg  40 mg Subcutaneous Q24H Marjan Rabbani, MD      . hydrocortisone sodium succinate (SOLU-CORTEF) 100 MG injection 20 mg  20 mg Intravenous Q8H Marjan Rabbani, MD      .  levothyroxine (SYNTHROID, LEVOTHROID) injection 37.5 mcg  37.5 mcg Intravenous Daily Marjan Rabbani, MD      . sodium chloride 0.9 % injection 3 mL  3 mL Intravenous Q12H Marjan Rabbani, MD      . valproate (DEPACON) 500 mg in dextrose 5 % 50 mL IVPB  500 mg Intravenous 4 times per day Juluis Mire, MD      . vancomycin (VANCOCIN) 2,000 mg in sodium chloride 0.9 % 500 mL IVPB  2,000 mg Intravenous Once Addison Lank, MD 250 mL/hr at 01/30/15 1406 2,000 mg at 01/30/15 1406    Allergies: Allergies as of 01/30/2015  . (No Known Allergies)   Past Medical History  Diagnosis Date  . Mental retardation   . Hypothyroid   . Anxiety   . Seizures   . Pituitary insufficiency   . Brain neoplasm    Past Surgical History  Procedure Laterality Date  . Shunt replacement      Brain   History reviewed. No pertinent family history. History   Social History  . Marital Status: Single    Spouse Name: N/A  . Number of Children: N/A  . Years of Education: N/A   Occupational History  . Not on file.   Social History Main Topics  . Smoking status: Never Smoker   . Smokeless tobacco: Not on file  .  Alcohol Use: No  . Drug Use: Not on file  . Sexual Activity: Not on file   Other Topics Concern  . Not on file   Social History Narrative  . No narrative on file    Review of Systems: Review of systems not obtained due to patient factors.  Physical Exam: Blood pressure 105/75, pulse 56, temperature 94 F (34.4 C), temperature source Oral, resp. rate 25, weight 190 lb (86.183 kg), SpO2 95 %. Physical Exam  Constitutional: No distress.  Patient is nonverbal. Lying in hospital bed, has some jerking head movements.  HENT:  Small bump on her head left temporal scalp. No bleeding or other signs of injuries.  Eyes: Conjunctivae are normal. Right eye exhibits no discharge. Left eye exhibits no discharge. No scleral icterus.  Pupils are reactive but dilated equally on both eyes.  Cardiovascular:  Exam reveals no friction rub.   No murmur heard. Slow but regular  Respiratory:  Poor effort. Limited anterior exam, no crackles or wheezes.  GI: Soft. Bowel sounds are normal. She exhibits no distension and no mass. There is no tenderness. There is no rebound.  Musculoskeletal: She exhibits no edema or tenderness.  Neurological:  Unable to assess. Follows commands minimally (squeezes fingers, moves legs and arms when asked). Nonverbal. Opens eyes spontaneously and also when asked.  Skin: She is not diaphoretic.    Lab results: Basic Metabolic Panel:  Recent Labs  01/30/15 1211 01/30/15 1219  NA 131* 129*  K 4.2 4.1  CL 92* 91*  CO2 29  --   GLUCOSE 115* 127*  BUN 14 18  CREATININE 0.45 0.60  CALCIUM 8.7*  --    Liver Function Tests:  Recent Labs  01/30/15 1211  AST 27  ALT 23  ALKPHOS 81  BILITOT 0.4  PROT 5.8*  ALBUMIN 3.0*   No results for input(s): LIPASE, AMYLASE in the last 72 hours. No results for input(s): AMMONIA in the last 72 hours. CBC:  Recent Labs  01/30/15 1219 01/30/15 1230  WBC  --  3.3*  NEUTROABS  --  1.2*  HGB 13.6 12.4  HCT 40.0 35.6*  MCV  --  89.0  PLT  --  PLATELET CLUMPS NOTED ON SMEAR, UNABLE TO ESTIMATE  CBG:  Recent Labs  01/30/15 1101 01/30/15 1234 01/30/15 1412  GLUCAP 59* 153* 84   Urinalysis:  Recent Labs  01/30/15 1119  COLORURINE YELLOW  LABSPEC 1.016  PHURINE 7.5  GLUCOSEU 500*  HGBUR MODERATE*  BILIRUBINUR NEGATIVE  KETONESUR NEGATIVE  PROTEINUR NEGATIVE  UROBILINOGEN 0.2  NITRITE NEGATIVE  LEUKOCYTESUR SMALL*   Misc. Labs:  Imaging results:  Dg Chest Port 1 View  01/30/2015   CLINICAL DATA:  Altered mental status, mental retardation  EXAM: PORTABLE CHEST - 1 VIEW  COMPARISON:  None.  FINDINGS: Cardiomediastinal silhouette is unremarkable. There is poor inspiration. No acute infiltrate or pleural effusion. No pulmonary edema. Mild basilar atelectasis.  IMPRESSION: No active disease.    Electronically Signed   By: Lahoma Crocker M.D.   On: 01/30/2015 12:09    Other results: EKG: none  Assessment & Plan by Problem: Active Problems:   Sepsis  34 yo female with hx of seizure, hypothyroidism, mental retardation, here with AMS and possible sepsis.  Possible sepsis vs AI crisis vs hypothyroidism (myxedema coma) unclear source. Recently finished treatment for pneumonia, was admitted to [facility] with pneumonia.  Patient presented with hypothermia, (Temp 90.2), hypotension, BP 80-90 (baseline 100), bradycardia (P 50's), hypoglycemia (Glucose  59 then 150 after 1 amp d50), and borderline hyponatremia of 129. CXR poor quality and poor effort but read as normal. UA normal.  Received 2L fluid boluses and started on vanc+zosyn for sepsis. Lactic acid 2.94. WBC 3.3 with atypical lymphocytes.  No ab tenderness one exam, no sinus tenderness OR other sources of infection Many possibilities including sepsis of unknown origin, myexedema coma (has been on synthyroid 85mcg daily), or relative AI (recenlty was on stress dose, perhaps tapered too fast?) - check TSH, if very high, will need to treat with both T3 and T4. - treat for AI with stress dose solucortef 20 mg TID.  - fluid bolus as needed to maintain BP - warming protocol - repeat CXR - f/up bcx and ucx. - cont vanc and zosyn for now - trend lactic acid - cont IVF 100cc/her D5NS.   AMS - could be 2/2 to sepsis or AI or hypothyroid coma - baseline mental status is unclear but per nursing home staff, she is more alert.  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 Was recenlty on 20mg  TID until 01/20/15 then back to 10mg  daily. - check AM cortisol  Hypothyroidism - on 72mcg synthyroid - f/up TSH and treat as needed as above.  Seizure - on phenytoin300mg /day, depakote ER 500mg  QID -check phenytoin and depakote level - cont current  regimen  Anxiety - on 1mg  BID ativan + 0.5mg  at 1pm - hold for now but will restart if is more awake to avoid withdrawal from chronic benzo  Depression - paxil - hold for now  dvt ppx: protonix Diet: NPO.  Dispo: Disposition is deferred at this time, awaiting improvement of current medical problems. Anticipated discharge in approximately 1-2 day(s).   The patient does have a current PCP (No primary care provider on file.) and does need an [facility] hospital follow-up appointment after discharge.  The patient does have transportation limitations that hinder transportation to clinic appointments.  Signed: Dellia Nims, MD 01/30/2015, 3:21 PM
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
## 2015-01-30 NOTE — ED Notes (Signed)
Pt has breakdown on sacrum. Appears to be stage II

## 2015-01-30 NOTE — Procedures (Signed)
Central Venous Catheter Insertion Procedure Note Shukri Nistler [id number] Sep 15, 1980  Procedure: Insertion of Central Venous Catheter Indications: Assessment of intravascular volume, Drug and/or fluid administration and Frequent blood sampling  Procedure Details Consent: Unable to obtain consent because of altered level of consciousness. Time Out: Verified patient identification, verified procedure, site/side was marked, verified correct patient position, special equipment/implants available, medications/allergies/relevent history reviewed, required imaging and test results available.  Performed  Maximum sterile technique was used including antiseptics, cap, gloves, gown, hand hygiene, mask and sheet. Skin prep: Chlorhexidine; local anesthetic administered A antimicrobial bonded/coated triple lumen catheter was placed in the left internal jugular vein using the Seldinger technique.  Evaluation Blood flow good Complications: No apparent complications Patient did tolerate procedure well. Chest X-ray ordered to verify placement.  CXR: pending.  Procedure performed under direct ultrasound guidance for real time vessel cannulation.      Montey Hora, [place] [facility] Pager: [phone]  or [phone] 01/30/2015, 11:10 PM   Merton Border, MD ; [facility] [phone].  After 5:30 PM or weekends, call [phone]

## 2015-01-30 NOTE — Progress Notes (Signed)
ANTIBIOTIC CONSULT NOTE - INITIAL  Pharmacy Consult for Vancomycin and Zosyn Indication: rule out sepsis  No Known Allergies  Patient Measurements: Height: 5\' 4"  (162.6 cm) Weight: 194 lb 3.6 oz (88.1 kg) IBW/kg (Calculated) : 54.7 Adjusted Body Weight:   Vital Signs: Temp: 94.4 F (34.7 C) (06/13 1700) Temp Source: Axillary (06/13 1700) BP: 111/91 mmHg (06/13 1800) Pulse Rate: 70 (06/13 1800) Intake/Output from previous day:   Intake/Output from this shift:    Labs:  Recent Labs  01/30/15 1211 01/30/15 1219 01/30/15 1230  WBC  --   --  3.3*  HGB  --  13.6 12.4  PLT  --   --  PLATELET CLUMPS NOTED ON SMEAR, UNABLE TO ESTIMATE  CREATININE 0.45 0.60  --    Estimated Creatinine Clearance: 106.5 mL/min (by C-G formula based on Cr of 0.6). No results for input(s): VANCOTROUGH, VANCOPEAK, VANCORANDOM, GENTTROUGH, GENTPEAK, GENTRANDOM, TOBRATROUGH, TOBRAPEAK, TOBRARND, AMIKACINPEAK, AMIKACINTROU, AMIKACIN in the last 72 hours.   Microbiology: Recent Results (from the past 720 hour(s))  MRSA PCR Screening     Status: None   Collection Time: 01/30/15  3:09 PM  Result Value Ref Range Status   MRSA by PCR NEGATIVE NEGATIVE Final    Comment:        The GeneXpert MRSA Assay (FDA approved for NASAL specimens only), is one component of a comprehensive MRSA colonization surveillance program. It is not intended to diagnose MRSA infection nor to guide or monitor treatment for MRSA infections.     Medical History: Past Medical History  Diagnosis Date  . Mental retardation   . Hypothyroid   . Anxiety   . Seizures   . Pituitary insufficiency   . Brain neoplasm     Medications:  Prescriptions prior to admission  Medication Sig Dispense Refill Last Dose  . albuterol (PROVENTIL HFA;VENTOLIN HFA) 108 (90 BASE) MCG/ACT inhaler Inhale 1-2 puffs into the lungs every 6 (six) hours as needed for wheezing or shortness of breath.   unknown  . Calcium Carb-Cholecalciferol  (CALCIUM 600 + D) 600-200 MG-UNIT TABS Take 1 tablet by mouth daily.   01/29/2015 at Unknown time  . desmopressin (DDAVP) 0.1 MG tablet Take 0.1 mg by mouth daily.   01/29/2015 at Unknown time  . divalproex (DEPAKOTE) 500 MG DR tablet Take 500 mg by mouth 4 (four) times daily.   01/29/2015 at Unknown time  . hydrocortisone (CORTEF) 10 MG tablet Take 10 mg by mouth 3 (three) times daily.   01/29/2015 at Unknown time  . levothyroxine (SYNTHROID) 75 MCG tablet Take 75 mcg by mouth daily before breakfast.   01/29/2015 at Unknown time  . LORazepam (ATIVAN) 1 MG tablet Take 0.5-1 mg by mouth 3 (three) times daily. 1mg  at 8am and 8pm, 0.5mg  at 1pm   01/29/2015 at Unknown time  . magnesium oxide (MAG-OX) 400 MG tablet Take 400 mg by mouth 2 (two) times daily.   01/29/2015 at Unknown time  . Omega-3 Fatty Acids (FISH OIL) 1000 MG CAPS Take 1,000 mg by mouth 2 (two) times daily.   01/29/2015 at Unknown time  . pantoprazole (PROTONIX) 40 MG tablet Take 40 mg by mouth daily.   01/29/2015 at Unknown time  . PARoxetine (PAXIL) 20 MG tablet Take 20 mg by mouth daily.   01/29/2015 at Unknown time  . phenytoin (DILANTIN) 50 MG tablet Chew 75 mg by mouth 2 (two) times daily.   01/29/2015 at Unknown time  . Vitamin D, Ergocalciferol, (DRISDOL) 50000 UNITS CAPS  capsule Take 50,000 Units by mouth every 7 (seven) days.   01/21/2015   Scheduled:  . desmopressin  10 mcg Nasal Daily  . enoxaparin (LOVENOX) injection  40 mg Subcutaneous Q24H  . folic acid  1 mg Intravenous Daily  . hydrocortisone sod succinate (SOLU-CORTEF) inj  20 mg Intravenous Q8H  . [START ON 01/31/2015] levothyroxine  50 mcg Intravenous QAC breakfast  . magnesium sulfate 1 - 4 g bolus IVPB  2 g Intravenous Once  . phenytoin (DILANTIN) IV  75 mg Intravenous Q12H  . sodium chloride  3 mL Intravenous Q12H  . valproate sodium  500 mg Intravenous 4 times per day   Infusions:  . sodium chloride 100 mL/hr at 01/30/15 1800  . dextrose 100 mL/hr at 01/30/15 1724    Assessment: 34yo female with history of mental retardation, hypothyroidism and seizures presents from SNF with AMS. Pharmacy is consulted to dose vancomycin and zosyn for suspected sepsis. Pt is hypothermic at 90.2, WBC 3.3, sCr 0.6, LA 2.94 down to 1.0.  Pt received vancomycin 2g and zosyn 3.375g IV in the ED.  Goal of Therapy:  Vancomycin trough level 15-20 mcg/ml  Plan:  Vancomycin 2g IV load followed by 1g q8h Zosyn 3.375g IV q8h Measure antibiotic drug levels at steady state Follow up culture results, renal function, and clinical course  Andrey Cota. Diona Foley, PharmD Clinical Pharmacist Pager [phone] 01/30/2015,8:05 PM

## 2015-01-30 NOTE — Progress Notes (Signed)
Spoke with patient's brother, Makalah Asberry (contact number: [phone]), who is her emergency contact. Mr. Tapp confirmed that patient was recently admitted to [facility] for pneumonia and discharged about 1 week ago. He has not seen her since she was discharged and does not know much about the hospitalization. During her admission, the patient became anemic, and [facility] called Mr. Bacha to request consent to give blood.   Mr. 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 She previously lived in [place] and moved to [place] in 2011. Since then, she has been doing fairly well living at [facility]. At baseline, she is verbal, able to carry a basic conversation, and independent of ADLs. She has fallen a few times and visited the ED but had not been hospitalized in the recent past before her recent admission for pneumonia. For the past 1.5-2 years, Mr. Craw reports that the patient has had "hallucinations." She saw a neurologist in [place] about 6 months ago, who thought it might be related to her Depakote dose. Mr. Pfalzgraf is unsure if the neurologist made any medication changes.

## 2015-01-30 NOTE — ED Notes (Signed)
POCT CBG RESULTED 84; Hope, RN notified

## 2015-01-30 NOTE — ED Notes (Signed)
bair hugger placed on pt and myself and Kim, Phleb assisted [place], RN with in and out cath; pt tolerated well; changed pt's diaper and placed a clean dry diaper on pt; pt resting at this time

## 2015-01-30 NOTE — ED Notes (Signed)
Pt from [facility] in [place]- has MR. Pt was D/Ced on June 4th for pneumonia. After D/c pt has not been back to norm. EMS states they were called out for unresponsiveness- pt responding to tactile stimuli. Unsure of last known normal time.  CBG 50. EMS gave amp D50. Recheck CBG 122. Pt cool to touch per EMS, axillary 89.5. Facility told EMS that pt's normal temp 92(??). BP 88 systolic. HR 50's Sinus Brady. EMS gave 1000 bolus NS. Facility reports normal BP 063 systolic. Baseline mental status- able to state name, follow commands, does not normally carry conversation, able to answer basic questions. EMS reports pt appears to be at mental baseline at this time.

## 2015-01-31 ENCOUNTER — Inpatient Hospital Stay (HOSPITAL_COMMUNITY): Payer: Medicare Other

## 2015-01-31 DIAGNOSIS — G9340 Encephalopathy, unspecified: Secondary | ICD-10-CM | POA: Insufficient documentation

## 2015-01-31 DIAGNOSIS — Z7952 Long term (current) use of systemic steroids: Secondary | ICD-10-CM

## 2015-01-31 DIAGNOSIS — H748X9 Other specified disorders of middle ear and mastoid, unspecified ear: Secondary | ICD-10-CM

## 2015-01-31 DIAGNOSIS — R569 Unspecified convulsions: Secondary | ICD-10-CM

## 2015-01-31 DIAGNOSIS — E871 Hypo-osmolality and hyponatremia: Secondary | ICD-10-CM

## 2015-01-31 DIAGNOSIS — Y999 Unspecified external cause status: Secondary | ICD-10-CM

## 2015-01-31 DIAGNOSIS — I959 Hypotension, unspecified: Secondary | ICD-10-CM

## 2015-01-31 DIAGNOSIS — D443 Neoplasm of uncertain behavior of pituitary gland: Secondary | ICD-10-CM

## 2015-01-31 DIAGNOSIS — T7611XA Adult physical abuse, suspected, initial encounter: Secondary | ICD-10-CM

## 2015-01-31 DIAGNOSIS — E039 Hypothyroidism, unspecified: Secondary | ICD-10-CM | POA: Diagnosis present

## 2015-01-31 DIAGNOSIS — J690 Pneumonitis due to inhalation of food and vomit: Secondary | ICD-10-CM | POA: Insufficient documentation

## 2015-01-31 DIAGNOSIS — T68XXXA Hypothermia, initial encounter: Secondary | ICD-10-CM | POA: Diagnosis present

## 2015-01-31 DIAGNOSIS — J9601 Acute respiratory failure with hypoxia: Secondary | ICD-10-CM | POA: Insufficient documentation

## 2015-01-31 DIAGNOSIS — E230 Hypopituitarism: Secondary | ICD-10-CM | POA: Diagnosis present

## 2015-01-31 DIAGNOSIS — I950 Idiopathic hypotension: Secondary | ICD-10-CM

## 2015-01-31 DIAGNOSIS — R5383 Other fatigue: Secondary | ICD-10-CM

## 2015-01-31 DIAGNOSIS — S40022A Contusion of left upper arm, initial encounter: Secondary | ICD-10-CM

## 2015-01-31 DIAGNOSIS — L8990 Pressure ulcer of unspecified site, unspecified stage: Secondary | ICD-10-CM | POA: Diagnosis present

## 2015-01-31 LAB — CBC WITH DIFFERENTIAL/PLATELET
BASOS PCT: 0 % (ref 0–1)
Basophils Absolute: 0 10*3/uL (ref 0.0–0.1)
EOS ABS: 0.1 10*3/uL (ref 0.0–0.7)
Eosinophils Relative: 1 % (ref 0–5)
HCT: 31.2 % — ABNORMAL LOW (ref 36.0–46.0)
HEMOGLOBIN: 10.7 g/dL — AB (ref 12.0–15.0)
Lymphocytes Relative: 25 % (ref 12–46)
Lymphs Abs: 1.4 10*3/uL (ref 0.7–4.0)
MCH: 30 pg (ref 26.0–34.0)
MCHC: 34.3 g/dL (ref 30.0–36.0)
MCV: 87.4 fL (ref 78.0–100.0)
MONO ABS: 0.9 10*3/uL (ref 0.1–1.0)
Monocytes Relative: 16 % — ABNORMAL HIGH (ref 3–12)
NEUTROS PCT: 58 % (ref 43–77)
Neutro Abs: 3 10*3/uL (ref 1.7–7.7)
PLATELETS: 123 10*3/uL — AB (ref 150–400)
RBC: 3.57 MIL/uL — ABNORMAL LOW (ref 3.87–5.11)
RDW: 15.9 % — AB (ref 11.5–15.5)
WBC: 5.4 10*3/uL (ref 4.0–10.5)

## 2015-01-31 LAB — POCT I-STAT 3, ART BLOOD GAS (G3+)
BICARBONATE: 23.8 meq/L (ref 20.0–24.0)
O2 SAT: 99 %
PCO2 ART: 31.7 mmHg — AB (ref 35.0–45.0)
PO2 ART: 139 mmHg — AB (ref 80.0–100.0)
Patient temperature: 97.3
TCO2: 25 mmol/L (ref 0–100)
pH, Arterial: 7.48 — ABNORMAL HIGH (ref 7.350–7.450)

## 2015-01-31 LAB — GLUCOSE, CAPILLARY
GLUCOSE-CAPILLARY: 30 mg/dL — AB (ref 65–99)
GLUCOSE-CAPILLARY: 64 mg/dL — AB (ref 65–99)
GLUCOSE-CAPILLARY: 105 mg/dL — AB (ref 65–99)
Glucose-Capillary: 128 mg/dL — ABNORMAL HIGH (ref 65–99)
Glucose-Capillary: 137 mg/dL — ABNORMAL HIGH (ref 65–99)
Glucose-Capillary: 48 mg/dL — ABNORMAL LOW (ref 65–99)
Glucose-Capillary: 130 mg/dL — ABNORMAL HIGH (ref 65–99)
Glucose-Capillary: 142 mg/dL — ABNORMAL HIGH (ref 65–99)
Glucose-Capillary: 113 mg/dL — ABNORMAL HIGH (ref 65–99)
Glucose-Capillary: 90 mg/dL (ref 65–99)

## 2015-01-31 LAB — CORTISOL-AM, BLOOD: CORTISOL - AM: 19.6 ug/dL (ref 6.7–22.6)

## 2015-01-31 LAB — BASIC METABOLIC PANEL
Anion gap: 9 (ref 5–15)
BUN: 11 mg/dL (ref 6–20)
CALCIUM: 7.9 mg/dL — AB (ref 8.9–10.3)
CO2: 25 mmol/L (ref 22–32)
Chloride: 96 mmol/L — ABNORMAL LOW (ref 101–111)
Creatinine, Ser: 0.69 mg/dL (ref 0.44–1.00)
GFR calc non Af Amer: >60 mL/min (ref >60)
Glucose, Bld: 115 mg/dL — ABNORMAL HIGH (ref 65–99)
POTASSIUM: 4.6 mmol/L (ref 3.5–5.1)
SODIUM: 130 mmol/L — AB (ref 135–145)

## 2015-01-31 LAB — T4, FREE: Free T4: 0.6 ng/dL — ABNORMAL LOW (ref 0.61–1.12)

## 2015-01-31 LAB — BLOOD GAS, ARTERIAL
ACID-BASE EXCESS: 3.2 mmol/L — AB (ref 0.0–2.0)
Bicarbonate: 27 mEq/L — ABNORMAL HIGH (ref 20.0–24.0)
Drawn by: 129711
FIO2: 0.21 %
O2 Saturation: 96.8 %
PH ART: 7.449 (ref 7.350–7.450)
Patient temperature: 98.6
TCO2: 28.2 mmol/L (ref 0–100)
pCO2 arterial: 39.5 mmHg (ref 35.0–45.0)
pO2, Arterial: 84.4 mmHg (ref 80.0–100.0)

## 2015-01-31 LAB — AMMONIA
Ammonia: 154 umol/L — ABNORMAL HIGH (ref 9–35)
Ammonia: 121 umol/L — ABNORMAL HIGH (ref 9–35)

## 2015-01-31 LAB — CORTISOL: Cortisol, Plasma: 5.9 ug/dL

## 2015-01-31 LAB — TROPONIN I: Troponin I: <0.03 ng/mL (ref <0.031)

## 2015-01-31 LAB — PHENYTOIN LEVEL, FREE AND TOTAL
Phenytoin, Free: 2.7 ug/mL — ABNORMAL HIGH (ref 1.0–2.0)
Phenytoin, Total: 12.5 ug/mL (ref 10.0–20.0)

## 2015-01-31 LAB — HIV ANTIBODY (ROUTINE TESTING W REFLEX): HIV SCREEN 4TH GENERATION: NONREACTIVE

## 2015-01-31 LAB — TSH: TSH: 0.192 u[IU]/mL — ABNORMAL LOW (ref 0.350–4.500)

## 2015-01-31 LAB — PROCALCITONIN: Procalcitonin: <0.1 ng/mL

## 2015-01-31 MED ORDER — PROPOFOL 1000 MG/100ML IV EMUL
5.0000 ug/kg/min | INTRAVENOUS | Status: DC
  Administered 2015-01-31 – 2015-02-01 (×2): 5 ug/kg/min via INTRAVENOUS
  Filled 2015-01-31 (×2): qty 100

## 2015-01-31 MED ORDER — DEXTROSE 50 % IV SOLN
25.0000 mL | Freq: Once | INTRAVENOUS | Status: AC
  Administered 2015-01-31: 25 mL via INTRAVENOUS

## 2015-01-31 MED ORDER — FENTANYL CITRATE (PF) 100 MCG/2ML IJ SOLN
INTRAMUSCULAR | Status: AC
  Administered 2015-01-31: 50 ug via INTRAVENOUS
  Filled 2015-01-31: qty 2

## 2015-01-31 MED ORDER — DEXTROSE 50 % IV SOLN
INTRAVENOUS | Status: AC
Start: 2015-01-31 — End: 2015-01-31
  Filled 2015-01-31: qty 50

## 2015-01-31 MED ORDER — AMIODARONE LOAD VIA INFUSION
150.0000 mg | Freq: Once | INTRAVENOUS | Status: DC
  Filled 2015-01-31: qty 83.34

## 2015-01-31 MED ORDER — MIDAZOLAM HCL 2 MG/2ML IJ SOLN
2.0000 mg | Freq: Once | INTRAMUSCULAR | Status: AC
  Administered 2015-01-31: 2 mg via INTRAVENOUS

## 2015-01-31 MED ORDER — VANCOMYCIN HCL IN DEXTROSE 1-5 GM/200ML-% IV SOLN
1000.0000 mg | Freq: Three times a day (TID) | INTRAVENOUS | Status: DC
  Administered 2015-01-31 – 2015-02-01 (×3): 1000 mg via INTRAVENOUS
  Filled 2015-01-31 (×4): qty 200

## 2015-01-31 MED ORDER — DEXTROSE 10 % IV SOLN
INTRAVENOUS | Status: DC
  Administered 2015-01-31 – 2015-02-01 (×2): via INTRAVENOUS

## 2015-01-31 MED ORDER — AMPICILLIN-SULBACTAM SODIUM 3 (2-1) G IJ SOLR
3.0000 g | Freq: Four times a day (QID) | INTRAMUSCULAR | Status: DC
  Administered 2015-01-31: 3 g via INTRAVENOUS
  Filled 2015-01-31 (×4): qty 3

## 2015-01-31 MED ORDER — MIDAZOLAM HCL 2 MG/2ML IJ SOLN
1.0000 mg | INTRAMUSCULAR | Status: DC | PRN
  Administered 2015-01-31 – 2015-02-01 (×2): 2 mg via INTRAVENOUS
  Administered 2015-02-03: 1 mg via INTRAVENOUS
  Filled 2015-01-31 (×3): qty 2

## 2015-01-31 MED ORDER — PIPERACILLIN-TAZOBACTAM 3.375 G IVPB
3.3750 g | Freq: Three times a day (TID) | INTRAVENOUS | Status: DC
  Administered 2015-01-31 – 2015-02-01 (×3): 3.375 g via INTRAVENOUS
  Filled 2015-01-31 (×5): qty 50

## 2015-01-31 MED ORDER — AMIODARONE HCL IN DEXTROSE 360-4.14 MG/200ML-% IV SOLN
30.0000 mg/h | INTRAVENOUS | Status: DC
  Filled 2015-01-31 (×2): qty 200

## 2015-01-31 MED ORDER — AMIODARONE HCL IN DEXTROSE 360-4.14 MG/200ML-% IV SOLN: 60.0000 mg/h | INTRAVENOUS | Status: DC

## 2015-01-31 MED ORDER — CETYLPYRIDINIUM CHLORIDE 0.05 % MT LIQD
7.0000 mL | Freq: Four times a day (QID) | OROMUCOSAL | Status: DC
  Administered 2015-02-01 – 2015-02-03 (×10): 7 mL via OROMUCOSAL

## 2015-01-31 MED ORDER — MIDAZOLAM HCL 2 MG/2ML IJ SOLN
INTRAMUSCULAR | Status: AC
  Administered 2015-01-31: 2 mg via INTRAVENOUS
  Filled 2015-01-31: qty 2

## 2015-01-31 MED ORDER — LACTULOSE ENEMA
300.0000 mL | Freq: Two times a day (BID) | ORAL | Status: DC
  Filled 2015-01-31 (×2): qty 300

## 2015-01-31 MED ORDER — SODIUM CHLORIDE 0.9 % IV SOLN
INTRAVENOUS | Status: DC
  Administered 2015-01-31 – 2015-02-01 (×3): via INTRAVENOUS

## 2015-01-31 MED ORDER — ROCURONIUM BROMIDE 50 MG/5ML IV SOLN
50.0000 mg | Freq: Once | INTRAVENOUS | Status: AC
  Administered 2015-01-31: 10 mg via INTRAVENOUS
  Filled 2015-01-31: qty 5

## 2015-01-31 MED ORDER — DEXTROSE 50 % IV SOLN
INTRAVENOUS | Status: AC
  Administered 2015-01-31: 50 mL via INTRAVENOUS
  Filled 2015-01-31: qty 50

## 2015-01-31 MED ORDER — SODIUM CHLORIDE 0.9 % IV BOLUS (SEPSIS)
1000.0000 mL | Freq: Once | INTRAVENOUS | Status: AC
  Administered 2015-01-31: 1000 mL via INTRAVENOUS

## 2015-01-31 MED ORDER — FENTANYL CITRATE (PF) 100 MCG/2ML IJ SOLN
100.0000 ug | Freq: Once | INTRAMUSCULAR | Status: AC
  Administered 2015-01-31: 100 ug via INTRAVENOUS

## 2015-01-31 MED ORDER — LACTULOSE 10 GM/15ML PO SOLN
30.0000 g | Freq: Two times a day (BID) | ORAL | Status: DC
  Administered 2015-01-31 – 2015-02-03 (×6): 30 g
  Filled 2015-01-31 (×8): qty 45

## 2015-01-31 MED ORDER — ETOMIDATE 2 MG/ML IV SOLN
20.0000 mg | Freq: Once | INTRAVENOUS | Status: AC
  Administered 2015-01-31: 20 mg via INTRAVENOUS

## 2015-01-31 MED ORDER — CHLORHEXIDINE GLUCONATE 0.12 % MT SOLN
15.0000 mL | Freq: Two times a day (BID) | OROMUCOSAL | Status: DC
  Administered 2015-01-31 – 2015-02-03 (×7): 15 mL via OROMUCOSAL
  Filled 2015-01-31 (×5): qty 15

## 2015-01-31 MED ORDER — HYDROCORTISONE NA SUCCINATE PF 100 MG IJ SOLR
50.0000 mg | Freq: Four times a day (QID) | INTRAMUSCULAR | Status: DC
  Administered 2015-01-31 – 2015-02-01 (×4): 50 mg via INTRAVENOUS
  Filled 2015-01-31 (×4): qty 1
  Filled 2015-01-31: qty 2
  Filled 2015-01-31 (×3): qty 1

## 2015-01-31 MED ORDER — FENTANYL CITRATE (PF) 100 MCG/2ML IJ SOLN
25.0000 ug | INTRAMUSCULAR | Status: DC | PRN
  Administered 2015-01-31: 50 ug via INTRAVENOUS
  Administered 2015-02-01 (×2): 25 ug via INTRAVENOUS
  Administered 2015-02-01: 50 ug via INTRAVENOUS
  Administered 2015-02-03: 25 ug via INTRAVENOUS
  Filled 2015-01-31 (×5): qty 2
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
## 2015-01-31 NOTE — Progress Notes (Signed)
[place] Progress Note Patient Name: Margaret Morgan DOB: 11/06/1980 MRN: [id number]   Date of Service  01/31/2015  HPI/Events of Note  Delirium. Already on Versed and Fentanyl IV PRN. BP = 106/74. CVP = 4.  eICU Interventions  Will order: 1. 0.9 NaCl 1 liter IV over 1 hour now.  2. Propofol IV infusion. Titrate to RASS = 0.      Intervention Category Major Interventions: Delirium, psychosis, severe agitation - evaluation and management Minor Interventions: Communication with other healthcare providers and/or family  Lysle Dingwall 01/31/2015, 8:31 PM

## 2015-01-31 NOTE — Progress Notes (Signed)
Patient in AFIB 120'-130's  bp 98/59. MD aware. Patient moving to ICU

## 2015-01-31 NOTE — Procedures (Signed)
Intubation Procedure Note Roise Emert [id number] 1980/12/21  Procedure: Intubation Indications: Airway protection and maintenance  Procedure Details Consent: Risks of procedure as well as the alternatives and risks of each were explained to the (patient/caregiver).  Consent for procedure obtained. Time Out: Verified patient identification, verified procedure, site/side was marked, verified correct patient position, special equipment/implants available, medications/allergies/relevent history reviewed, required imaging and test results available.  Performed  Maximum sterile technique was used including gloves, hand hygiene and mask.  MAC    Evaluation Hemodynamic Status: BP stable throughout; O2 sats: stable throughout Patient's Current Condition: stable Complications: No apparent complications Patient did tolerate procedure well. Chest X-ray ordered to verify placement.  CXR: pending.   Jennet Maduro 01/31/2015

## 2015-01-31 NOTE — Progress Notes (Signed)
Called report to anita on [id number]. Pt transferring to [id number] with rapid response.

## 2015-01-31 NOTE — Consult Note (Signed)
Neurology Consultation Reason for Consult: AMS Referring Physician: Orie Fisherman  CC: AMS  History is obtained from:Chart review  HPI: Margaret Morgan is a 34 y.o. female with a history of MR, hypothyroidism, ([facility]-HCC) presented with AMS concerning for sepsis. Per MS note from conversation with brother, at baseline, she is verbal, able to carry a basic conversation, and independent of ADLs. She occasionally will have hallucinations.   She was admitted to [place] and treated fro pneumonia, finisheing levaquin on 06/06. Since that stay at [place], she apparently has not been as active/alert as she normally was.   On presentation, temp was 90.2, she was started on solucortef last evening at 6pm.   Due to continued AMS, neurology was consulted.    ROS:  Unable to obtain due to altered mental status.   Past Medical History  Diagnosis Date  . Mental retardation   . Hypothyroid   . Anxiety   . Seizures   . Pituitary insufficiency   . Brain neoplasm     Family History: Unable to assess secondary to patient's altered mental status.    Social History: Tob: Unable to assess secondary to patient's altered mental status.    Exam: Current vital signs: BP 98/47 mmHg  Pulse 80  Temp(Src) 98.3 F (36.8 C) (Oral)  Resp 31  Ht 5\' 4"  (1.626 m)  Wt 88.4 kg (194 lb 14.2 oz)  BMI 33.44 kg/m2  SpO2 97%  LMP  (LMP Unknown) Vital signs in last 24 hours: Temp:  [93.6 F (34.2 C)-98.7 F (37.1 C)] 98.3 F (36.8 C) (06/14 0800) Pulse Rate:  [54-83] 80 (06/14 1030) Resp:  [7-31] 31 (06/14 1030) BP: (78-117)/(30-98) 98/47 mmHg (06/14 1030) SpO2:  [93 %-100 %] 97 % (06/14 0800) Weight:  [88.1 kg (194 lb 3.6 oz)-88.4 kg (194 lb 14.2 oz)] 88.4 kg (194 lb 14.2 oz) (06/14 0439)   Physical Exam  Constitutional: Appears well nourished Psych: does not answer questions Eyes: No scleral injection HENT: No OP obstrucion Head: Normocephalic.  Cardiovascular: Normal rate and regular rhythm.   Respiratory: Effort normal  GI: Soft.   Skin: WDI  Neuro: Mental Status: Patient is drowsy but arousable. With noxious stimulus, she says "Ow" She does not follow commands.  Cranial Nerves: II: blinks to threat bilaterally. Pupils are large but equal, round, and reactive to light.   III,IV, VI: does not clearly look to either side fully.  V, VII: blinks to eyelid stimulation bilaterally VIII, X, XI, XII: Unable to assess secondary to patient's altered mental status.  Motor: Localizes briskly x 4.  Sensory: As above Deep Tendon Reflexes: Depressed at the knees, 1+ at the bices Cerebellar: Unable to assess secondary to patient's altered mental status.   She has recurrent mulitfocal myoclonic jerks  I have reviewed labs in epic and the results pertinent to this consultation are: abg - no explanation for ams Lactate 2.94 on admission Valproate - borderline at 49 Dilantin 14.1 TSH is low, but I suspect this is not helpful given pituitary insufficiency.  T4 slightly low.  Recurrent hypoglycemia.   I have reviewed the images obtained:CT head- mildly enlarged ventricles with preserved sulci(old finding) post-surgical changes. NAICA  Impression: 34 yo F with h/o brain tumor who presents with AMS. Low temp, persistently low BPs, history of pan-hypopit all could be suggestive of secondary adrenal insufficiency as a cause for her AMS. Septic encephalopathy also would be a possible cause. Hypothyroidism can certainly cause mental status changes in the setting of an  acute infection. Hyperammonemic encephalopathy associated with depakote can also   Recommendations: 1) T3, will defer to IM for management of hypothyroidism.  2) Ammonia 3) Agree with need for treatment of suspected adrenal insufficiency.  4) EEG   Roland Rack, MD [facility] [phone]  If 7pm- 7am, please page neurology on call as listed in [place].

## 2015-01-31 NOTE — Progress Notes (Signed)
Hypoglycemic Event  CBG: 48  Treatment: D50 IV 25 mL  Symptoms: Pale, lethargi  Follow-up CBG: Time:0910 CBG Result:130  Possible Reasons for Event: Inadequate meal intake  Comments/MD notified:Paged MD. Received order for D10@100     Margaret Morgan  Remember to initiate Hypoglycemia Order Set & complete
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
## 2015-01-31 NOTE — Consult Note (Signed)
PULMONARY / CRITICAL CARE MEDICINE   Name: Margaret Morgan MRN: [id number] DOB: [birth year]    ADMISSION DATE:  01/30/2015 CONSULTATION DATE:  01/31/2015  REFERRING MD :  Dareen Piano  CHIEF COMPLAINT:  AMS  INITIAL PRESENTATION: 34 year old female with intellectual disability admitted 6/13 for AMS and presumed sepsis, which worsened despite broad spectrum antibiotics,  IVF resuscitation, and low dose stress steroids. AMS worsening, PMS consulted.   STUDIES:  6/13 CT head > No acute abnormality. 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 The patient has a left mastoid effusion which is new since the comparison examination.  SIGNIFICANT EVENTS:   HISTORY OF PRESENT ILLNESS:  34 year old female with PMH as below, which includes mental retardation, hypothyroidism, pituitary insufficiency, seizures, and brain neoplasm ([facility]) s/p shunt placement. Minimally verbal at baseline. She was recently admitted to [facility] for PNA and finished levaquin 6/6. She presented to [facility] ED 6/13 from [facility] with AMS and was admitted for presumed sepsis. In ED she was found to be profoundly hypothermic (baseline temp reportedly 61F), hypotensive (baseline 100), and pulse in 50s. She was also hypoglycemic. She was given volume and started on broad spectrum ABX along with supportive care. She was also started on low dose steroids for stress response. 6/14, however, her LOC seemed to somewhat worsen as she was no longer spontaneously alert and required painful stimuli to arouse. PCCM called to evaluate patient in setting of this acute change.  PAST MEDICAL HISTORY :   has a past medical history of Mental retardation; Hypothyroid; Anxiety; Seizures; Pituitary insufficiency; and Brain neoplasm.  has past surgical history that includes Shunt replacement. Prior to Admission medications   Medication Sig Start Date End Date Taking? Authorizing Provider   albuterol (PROVENTIL HFA;VENTOLIN HFA) 108 (90 BASE) MCG/ACT inhaler Inhale 1-2 puffs into the lungs every 6 (six) hours as needed for wheezing or shortness of breath.   Yes Historical Provider, MD  Calcium Carb-Cholecalciferol (CALCIUM 600 + D) 600-200 MG-UNIT TABS Take 1 tablet by mouth daily.   Yes Historical Provider, MD  desmopressin (DDAVP) 0.1 MG tablet Take 0.1 mg by mouth daily.   Yes Historical Provider, MD  divalproex (DEPAKOTE) 500 MG DR tablet Take 500 mg by mouth 4 (four) times daily.   Yes Historical Provider, MD  hydrocortisone (CORTEF) 10 MG tablet Take 10 mg by mouth 3 (three) times daily.   Yes Historical Provider, MD  levothyroxine (SYNTHROID) 75 MCG tablet Take 75 mcg by mouth daily before breakfast.   Yes Historical Provider, MD  LORazepam (ATIVAN) 1 MG tablet Take 0.5-1 mg by mouth 3 (three) times daily. 1mg  at 8am and 8pm, 0.5mg  at 1pm   Yes Historical Provider, MD  magnesium oxide (MAG-OX) 400 MG tablet Take 400 mg by mouth 2 (two) times daily.   Yes Historical Provider, MD  Omega-3 Fatty Acids (FISH OIL) 1000 MG CAPS Take 1,000 mg by mouth 2 (two) times daily.   Yes Historical Provider, MD  pantoprazole (PROTONIX) 40 MG tablet Take 40 mg by mouth daily.   Yes Historical Provider, MD  PARoxetine (PAXIL) 20 MG tablet Take 20 mg by mouth daily.   Yes Historical Provider, MD  phenytoin (DILANTIN) 50 MG tablet Chew 75 mg by mouth 2 (two) times daily.   Yes Historical Provider, MD  Vitamin D, Ergocalciferol, (DRISDOL) 50000 UNITS CAPS capsule Take 50,000 Units by mouth every 7 (seven) days.   Yes Historical Provider,  MD   No Known Allergies  FAMILY HISTORY:  has no family status information on file.  SOCIAL HISTORY:  reports that she has never smoked. She does not have any smokeless tobacco history on file. She reports that she does not drink alcohol.  REVIEW OF SYSTEMS:  unable  SUBJECTIVE:   VITAL SIGNS: Temp:  [94.4 F (34.7 C)-100 F (37.8 C)] 100 F (37.8 C)  (06/14 1230) Pulse Rate:  [60-86] 73 (06/14 1430) Resp:  [7-34] 21 (06/14 1430) BP: (78-117)/(30-98) 93/52 mmHg (06/14 1430) SpO2:  [95 %-100 %] 100 % (06/14 1230) Weight:  [88.4 kg (194 lb 14.2 oz)] 88.4 kg (194 lb 14.2 oz) (06/14 0439) HEMODYNAMICS:   VENTILATOR SETTINGS:   INTAKE / OUTPUT:  Intake/Output Summary (Last 24 hours) at 01/31/15 1552 Last data filed at 01/31/15 1400  Gross per 24 hour  Intake   3460 ml  Output   1000 ml  Net   2460 ml    PHYSICAL EXAMINATION: General:  Chronically ill appearing older than state age female. Neuro:  Gag intact, not following commands but tracking in room. Head: [place]/AT. EENT:  PERRL, EOM-I and MMM. Cardiovascular:  RRR, Nl S1/S2, -M/R/G. Lungs:  CTA bilaterally but poor air movement. Abdomen:  Soft, NT, ND and +BS. Musculoskeletal:  -edema and -tenderness. Skin:  Rough but intact.  LABS:  CBC  Recent Labs Lab 01/30/15 1230 01/30/15 2025 01/31/15 0251  WBC 3.3* 2.3* 5.4  HGB 12.4 10.3* 10.7*  HCT 35.6* 29.1* 31.2*  PLT PLATELET CLUMPS NOTED ON SMEAR, UNABLE TO ESTIMATE 117* 123*   Coag's  Recent Labs Lab 01/30/15 2025  INR 1.23   BMET  Recent Labs Lab 01/30/15 1211 01/30/15 1219 01/31/15 0251  NA 131* 129* 130*  K 4.2 4.1 4.6  CL 92* 91* 96*  CO2 29  --  25  BUN 14 18 11   CREATININE 0.45 0.60 0.69  GLUCOSE 115* 127* 115*   Electrolytes  Recent Labs Lab 01/30/15 1211 01/30/15 1635 01/31/15 0251  CALCIUM 8.7*  --  7.9*  MG  --  1.4*  --   PHOS  --  2.6  --    Sepsis Markers  Recent Labs Lab 01/30/15 1219 01/30/15 1635 01/31/15 1300  LATICACIDVEN 2.94* 1.0  --   PROCALCITON  --   --  <0.10   ABG  Recent Labs Lab 01/30/15 1258 01/31/15 0947  PHART 7.404 7.449  PCO2ART 49.1* 39.5  PO2ART 28.0* 84.4   Liver Enzymes  Recent Labs Lab 01/30/15 1211  AST 27  ALT 23  ALKPHOS 81  BILITOT 0.4  ALBUMIN 3.0*   Cardiac Enzymes No results for input(s): TROPONINI, PROBNP in the last  168 hours. Glucose  Recent Labs Lab 01/31/15 0841 01/31/15 0844 01/31/15 0909 01/31/15 1223 01/31/15 1246 01/31/15 1520  GLUCAP 30* 48* 130* 64* 142* 105*    Imaging Ct Head Wo Contrast  01/30/2015   ADDENDUM REPORT: 01/30/2015 18:19  ADDENDUM: This addendum is given for the purpose of noting the patient has a left mastoid effusion which is new since the comparison examination.   Electronically Signed   By: Inge Rise M.D.   On: 01/30/2015 18:19   01/30/2015   CLINICAL DATA:  Patient unresponsive.  EXAM: CT HEAD WITHOUT CONTRAST  TECHNIQUE: Contiguous axial images were obtained from the base of the skull through the vertex without intravenous contrast.  COMPARISON:  Head CT scan 10/29/2014 and 05/07/2011.  FINDINGS: As on the most recent examination, 3  ventriculostomy shunt catheters, 1 from a right frontal approach and two from a left frontal approach are identified. Encephalomalacia in the right frontal lobe is unchanged. Expansion of the sella turcica with a calcified lesion identified and a cystic collection to the right of the sella are unchanged in appearance. No evidence of acute infarction, hemorrhage, midline shift or abnormal extra-axial fluid collection is identified. No hydrocephalus or pneumocephalus. The calvarium is intact.  IMPRESSION: No acute abnormality.  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  Electronically Signed: By: Inge Rise M.D. On: 01/30/2015 18:13   Portable Chest 1 View  01/31/2015   CLINICAL DATA:  Hypoxia.  EXAM: PORTABLE CHEST - 1 VIEW  COMPARISON:  None.  FINDINGS: Left IJ line in stable position. Mediastinum and hilar structures stable. Heart size stable. Low lung volumes. A mild infiltrate right lung base cannot be completely excluded. Follow-up chest x-ray suggested. No pleural effusion or pneumothorax.  IMPRESSION: 1. Left IJ line in stable position. 2. Low lung volumes. A  mild developing infiltrate right lung base cannot be completely excluded. Follow-up chest x-rays suggested .   Electronically Signed   By: Marcello Moores  Register   On: 01/31/2015 08:10   Dg Chest Port 1 View  01/31/2015   CLINICAL DATA:  Central line placement.  EXAM: PORTABLE CHEST - 1 VIEW  COMPARISON:  01/30/2015  FINDINGS: Shallow inspiration. Left central venous catheter placed with tip over the cavoatrial junction. No pneumothorax. The heart size and mediastinal contours are within normal limits. Both lungs are clear. The visualized skeletal structures are unremarkable.  IMPRESSION: Left central venous catheter appears in satisfactory location. No evidence of active pulmonary disease.   Electronically Signed   By: Lucienne Capers M.D.   On: 01/31/2015 00:26   Attending Note  I reviewed the CXR myself, low lung volume and left IJ in place.  Discussed with Elink-MD and PCCM-NP.  ASSESSMENT / PLAN:  PULMONARY A: Hypoxemia, baseline unknown.  Protecting her airway.  P:   - Supplemental O2. - Titrate for sat of 88-92%.  CARDIOVASCULAR CVL LIJ 6/13 >>> A: Hypotension, likely related to adrenal insufficiency. P:  - Change hydrocortisone dose to the appropriate dose of stress dose steroids which is 50 mg IV q6 hours. - Check cortisol level now. - Monitor CVP. - Ginger IVF.  RENAL A:  Hyponatremia and hypochloremia. P:   - NS as ordered, 75 ml/hr.  GASTROINTESTINAL A:  High ammonia with no evidence of liver failure. P:   - Repeat ammonia level. - Doppler of the liver to r/o budd-chiari syndrome. - Liver ultrasound. - Will write for NGT and for lactulose.  HEMATOLOGIC A:  WBC depressed likely due to medications. P:  - Continue to monitor.  INFECTIOUS A:  Unsure where source of infection is but started on abx. P:   Vanc/zosyn per primary.  ENDOCRINE A:  Patient is hypothermic, hypoglycemic and hypotensive, these are all signs of adrenal insufficiency so patient must not  have been getting appropriate dosing.   Hypothyroid. P:   - Increase hydrocortisone dose. - Draw stat cortisol level now. - Continue replacement.  NEUROLOGIC A:  Unclear baseline but more lethargic.  Likely related to poor supplementation. P:   - See endocrine section. - Monitor. - Neurology following. - Hold any and all sedating medications.  Rush Farmer, M.D. [facility] Pulmonary/Critical Care Medicine. Pager: [phone]. After hours pager: [phone].  01/31/2015, 3:52 PM

## 2015-01-31 NOTE — Progress Notes (Signed)
[facility] Progress Note Patient Name: Maggy Wyble DOB: October 30, 1980 MRN: [id number]   Date of Service  01/31/2015  HPI/Events of Note  Patient lethargic. Bedside nurse does not feel that patient can be managed from a nursing point of view in a step down unit.   eICU Interventions  Transfer to ICU.     Intervention Category Major Interventions: Change in mental status - evaluation and management  [name],Steven Eugene 01/31/2015, 4:55 PM

## 2015-01-31 NOTE — Progress Notes (Signed)
Discussed patient condition to include cardiac rhythm of NSR HR 70-80, with Dr Emmit Alexanders at [facility]. Dr Emmit Alexanders does not want Amiodarone given at this time but does want to monitor for possible future need

## 2015-01-31 NOTE — Procedures (Signed)
ELECTROENCEPHALOGRAM REPORT  Patient: Margaret Morgan       Room #: [phone]  EEG No. ID: [id number] Age: 34 y.o.        Sex: female Referring Physician: Orie Fisherman Report Date:  01/31/2015        Interpreting Physician: Anthony Sar  History: Margaret Morgan is an 35 y.o. female with a history of mental retardation, hypothyroidism, seizure disorder, pituitary insufficiency and Rathke's pouch tumor, admitted with altered mental status with possible sepsis.  Indications for study:  Assess severity of encephalopathy; rule out seizure activity.  Technique: This is an 18 channel routine scalp EEG performed at the bedside with bipolar and monopolar montages arranged in accordance to the international 10/20 system of electrode placement.   Description: Patient was intubated and on mechanical ventilation at the time of this study. Predominant cerebral activity consisted of diffuse mixed delta and theta activity of moderate to at times slightly high amplitude. Pattern varied from being continuous to varying and amplitude with a burst-suppression type appearance. Photic stimulation was not performed. No epileptiform discharges were recorded.  Interpretation: This EEG is abnormal with moderately severe nonspecific generalized slowing of cerebral activity consistent with a moderately severe encephalopathic abnormality. No evidence of seizure activity was demonstrated.   Rush Farmer M.D. [facility] [phone]

## 2015-01-31 NOTE — Care Management Note (Addendum)
Case Management Note  Patient Details  Name: Margaret Morgan MRN: [id number] Date of Birth: [birth year]  Subjective/Objective:                 Pt from [facility]  ALF with hx of Mental retardation, hypothyroidism, seizures, and  brain neoplasm admitted  with AMS /sepsis.   Action/Plan: ? SNF  when medically stable. CM to f/u with d/c disposition.  Expected Discharge Date:                  Expected Discharge Plan:  Assisted Living / Rest Home  In-House Referral:  Clinical Social Work ([place])  Discharge planning Services  CM Consult  Post Acute Care Choice:    Choice offered to:     DME Arranged:    DME Agency:     HH Arranged:    HH Agency:     Status of Service:  In process, will continue to follow  Medicare Important Message Given:    Date Medicare IM Given:    Medicare IM give by:    Date Additional Medicare IM Given:    Additional Medicare Important Message give by:     If discussed at [place] of Stay Meetings, dates discussed:    Additional Comments: Hoover Browns Baisch (brother) [phone] Sharin Mons, RN 01/31/2015, 11:37 AM

## 2015-01-31 NOTE — Progress Notes (Signed)
Pt arrived to unit stable, md at bedside

## 2015-01-31 NOTE — Progress Notes (Signed)
Hypoglycemic Event  CBG: 64  Treatment: 1/2 amp d50 given  Symptoms: Pale  Follow-up CBG: Time:1247 CBG Result:141  Possible Reasons for Event: NPO  Comments/MD notified:notified MD, received order for 1/2 amp D50 given, gave med. Will continue to follow    Irish Elders  Remember to initiate Hypoglycemia Order Set & complete

## 2015-01-31 NOTE — Progress Notes (Signed)
SLP Cancellation Note  Patient Details Name: Margaret Morgan MRN: [id number] DOB: July 03, 1981   Cancelled treatment:       Reason Eval/Treat Not Completed: Patient's level of consciousness   Zebulan Hinshaw, Katherene Ponto 01/31/2015, 10:02 AM

## 2015-01-31 NOTE — Progress Notes (Signed)
In room to follow up as staff just finished attempt at NGT insertion - RN Earnest Bailey reports patient had episode of desaturation with vomiting of clear fluid and some bloody nasal return.  Attempt to suction patients mouth = she clamped down - unable to suction - RR 26 O2 sats now 88-94% - staff concerned with airway protection and concern for aspiration.  Patient is much more lethargic than yesterday.  PCCM has consulted - spoke with Dr. Nelda Marseille - updated on this event - orders to move to ICU.  Has now flipped into afib rate 143 -  Bp 98/45  RR 30 O2 sats 97% Dr. Nelda Marseille aware.

## 2015-01-31 NOTE — Progress Notes (Signed)
Patient is very lethargic.  HR= 77 NSR Bp= 89/41 Respirations=11 O2= 96 percent on room air Blood sugar=130  Patient responds only to painful stimuli. Notified MD. Will continue to monitor.

## 2015-01-31 NOTE — Consult Note (Signed)
[place] NOTE  Pharmacy Consult for :  Vancomycin, Zosyn Indication:  HCAP  Hospital Problems Active Problems:   Sepsis   Pressure ulcer   Pituitary deficiency   Hypothermia   Hypothyroidism   Seizures   CURRENTLY: Dosing Weight: 88.4 kg   Tc 100 F (37.8 C) (Rectal)  LABS:  Recent Labs  01/30/15 1211 01/30/15 1219 01/30/15 2025 01/31/15 0251  WBC  --   --  2.3* 5.4  HGB  --  13.6 10.3* 10.7*  PLT  --   --  117* 123*  CREATININE 0.45 0.60  --  0.69    Lab 01/30/15 1211 01/30/15 1219 01/30/15 1230 01/30/15 1635 01/30/15 2025 01/31/15 0251  NA 131* 129*  --   --   --  130*  K 4.2 4.1  --   --   --  4.6  CL 92* 91*  --   --   --  96*  CO2 29  --   --   --   --  25  GLUCOSE 115* 127*  --   --   --  115*  BUN 14 18  --   --   --  11  CREATININE 0.45 0.60  --   --   --  0.69  CALCIUM 8.7*  --   --   --   --  7.9*  MG  --   --   --  1.4*  --   --   PHOS  --   --   --  2.6  --   --     CrCl: Estimated Creatinine Clearance: 106.7 mL/min (by C-G formula based on Cr of 0.69).  MICROBIOLOGY: Lab Results  Component Value Date   CULT  01/30/2015           BLOOD CULTURE RECEIVED NO GROWTH TO DATE CULTURE WILL BE HELD FOR 5 DAYS BEFORE ISSUING A FINAL NEGATIVE REPORT Performed at [facility]  01/30/2015           BLOOD CULTURE RECEIVED NO GROWTH TO DATE CULTURE WILL BE HELD FOR 5 DAYS BEFORE ISSUING A FINAL NEGATIVE REPORT Performed at [facility] 01/30/15 1200 01/30/15 1230  CULT        BLOOD CULTURE RECEIVED NO GROWTH TO DATE CULTURE WILL BE HELD FOR 5 DAYS BEFORE ISSUING A FINAL NEGATIVE REPORTPerformed at [facility]        BLOOD CULTURE RECEIVED NO GROWTH TO DATE CULTURE WILL BE HELD FOR 5 DAYS BEFORE ISSUING A FINAL NEGATIVE REPORTPerformed at [facility]  SDES BLOOD RIGHT HAND BLOOD RIGHT ARM    MEDICATIONS: Scheduled:  Scheduled:  . desmopressin  10  mcg Nasal Daily  . enoxaparin (LOVENOX) injection  40 mg Subcutaneous Q24H  . folic acid  1 mg Intravenous Daily  . hydrocortisone sod succinate (SOLU-CORTEF) inj  50 mg Intravenous Q6H  . lactulose  300 mL Rectal BID  . levothyroxine  50 mcg Intravenous QAC breakfast  . phenytoin (DILANTIN) IV  75 mg Intravenous Q12H  . sodium chloride  3 mL Intravenous Q12H  . valproate sodium  500 mg Intravenous 4 times per day   Infusion[s]: Infusions:  . sodium chloride 75 mL/hr at 01/31/15 1235  . dextrose 100 mL/hr at 01/31/15 0852   Antibiotic[s]: Anti-infectives    Start     Dose/Rate Route Frequency Ordered Stop   01/31/15 1000  Ampicillin-Sulbactam (UNASYN) 3 g in sodium chloride 0.9 % 100 mL IVPB  Status:  Discontinued     3 g 100 mL/hr over 60 Minutes Intravenous Every 6 hours 01/31/15 0954 01/31/15 1530   01/30/15 2200  vancomycin (VANCOCIN) IVPB 1000 mg/200 mL premix  Status:  Discontinued     1,000 mg 200 mL/hr over 60 Minutes Intravenous Every 8 hours 01/30/15 2013 01/31/15 0945   01/30/15 2030  piperacillin-tazobactam (ZOSYN) IVPB 3.375 g     3.375 g 100 mL/hr over 30 Minutes Intravenous  Once 01/30/15 2011 01/30/15 2217   01/30/15 1300  vancomycin (VANCOCIN) 2,000 mg in sodium chloride 0.9 % 500 mL IVPB     2,000 mg 250 mL/hr over 120 Minutes Intravenous  Once 01/30/15 1252 01/30/15 1606   01/30/15 1300  piperacillin-tazobactam (ZOSYN) IVPB 3.375 g     3.375 g 100 mL/hr over 30 Minutes Intravenous  Once 01/30/15 1252 01/30/15 1400      ASSESSMENT  34yo female with history of mental retardation, hypothyroidism and seizures transferred from SNF with AMS. Pharmacy is consulted to re-dose vancomycin and zosyn for HCAP.  Unasyn discontinued.:  Patient previously on Vancomycin and Zosyn prior to change to Unasyn.  Renal function stable.  GOAL:    Vancomycin trough level 15-20 mcg/ml   Zosyn dosed for HCAP indication and adjusted for renal function.  PLAN: 1. Restart  Vancomycin 1 gm IV q 8 hours. 2. Restart Zosyn 3.375 gm IV q 8 hours, each dose to infuse over 4 hours 3. Follow up SCr, UOP, cultures, clinical course, Vancomycin levels as clinically indicated, and adjust as required.  Marthenia Rolling,  Pharm.D   01/31/2015,  3:45 PM

## 2015-01-31 NOTE — Progress Notes (Signed)
EEG completed; results pending.    
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
## 2015-01-31 NOTE — Progress Notes (Addendum)
Patient vomited and aspirated, also had a nose bleed during the NGT placement process and aspirated that.  She is desaturating now and mental status is worse.  Patient was evaluated again and decision is made to move to the ICU and intubate.  Upon arrival to the ICU she was making some improvement but remained hypoxemic.  Given findings, will intubate and sedate.  I spoke with her brother [name] who insisted on full code.  During the aspiration episode the patient developed new onset a-fib, will check troponins, TSH, free T4 and start amiodarone drip for HR control.  The patient is critically ill with multiple organ systems failure and requires high complexity decision making for assessment and support, frequent evaluation and titration of therapies, application of advanced monitoring technologies and extensive interpretation of multiple databases.   Critical Care Time devoted to patient care services described in this note is  45  Minutes. This time reflects time of care of this signee Dr Jennet Maduro. This critical care time does not reflect procedure time, or teaching time or supervisory time of PA/NP/Med student/Med Resident etc but could involve care discussion time.  Rush Farmer, M.D. [facility] Pulmonary/Critical Care Medicine. Pager: [phone]. After hours pager: [phone].

## 2015-01-31 NOTE — Progress Notes (Signed)
Initial Nutrition Assessment  DOCUMENTATION CODES:  Obesity unspecified  INTERVENTION:   When NGT placed, initiate TF via NGT with Osmolite 1.2 at 20 ml/h, increase by 10 ml every 4 hours to goal rate of 60 ml/h to provide 1728 kcals, 80 gm protein, 1181 ml free water daily.   NUTRITION DIAGNOSIS:  Inadequate oral intake related to inability to eat as evidenced by NPO status.   GOAL:  Patient will meet greater than or equal to 90% of their needs   MONITOR:  TF tolerance, Weight trends, Labs  REASON FOR ASSESSMENT:  Consult Enteral/tube feeding initiation and management  ASSESSMENT:  34 yo female with hx of Mental retardation, hypothyroidism, seizures, brain neoplasm ([facility]) admitted on 6/13 from nursing home ([facility]), with AMS and also concerning for sepsis.  Per discussion with RN, patient does not have enteral access at this time. She will require an NGT placed by RN to start TF. CCM is currently seeing patient. Unable to complete Nutrition-Focused physical exam at this time.    Height:  Ht Readings from Last 1 Encounters:  01/30/15 5\' 4"  (1.626 m)    Weight:  Wt Readings from Last 1 Encounters:  01/31/15 194 lb 14.2 oz (88.4 kg)    Ideal Body Weight:  54.5 kg  Wt Readings from Last 10 Encounters:  01/31/15 194 lb 14.2 oz (88.4 kg)    BMI:  Body mass index is 33.44 kg/(m^2).  Estimated Nutritional Needs:  Kcal:  1700-1900  Protein:  80-100 gm  Fluid:  1.8 L  Skin:  Wound (see comment) (stage 1 to left heel, MASD to buttocks)  Diet Order:  Diet NPO time specified  EDUCATION NEEDS:  No education needs identified at this time   Intake/Output Summary (Last 24 hours) at 01/31/15 1621 Last data filed at 01/31/15 1400  Gross per 24 hour  Intake   3460 ml  Output   1000 ml  Net   2460 ml    Last BM:  6/14   Molli Barrows, RD, LDN, [place] Pager [phone] After Hours Pager [phone]

## 2015-01-31 NOTE — Progress Notes (Signed)
NGT placed as per Dr. Nelda Marseille. Verified via auscultation with rapid response nurse Hilda Blades RN

## 2015-01-31 NOTE — Progress Notes (Signed)
Attempted to place NG tube per MD order. Patient turned blue and o2 sats dropped into the 40's. Rapid response RN in room to assist.  Patient bleeding from both nares and began to vomit clear fluid. Suctioned patient. Patient remains lethargic and unable to follow commands. Called elink-Due to risk of aspiration and lack of protecting airway moving patient to ICU.

## 2015-02-01 ENCOUNTER — Inpatient Hospital Stay (HOSPITAL_COMMUNITY): Payer: Medicare Other

## 2015-02-01 DIAGNOSIS — G9340 Encephalopathy, unspecified: Secondary | ICD-10-CM

## 2015-02-01 DIAGNOSIS — E7220 Disorder of urea cycle metabolism, unspecified: Secondary | ICD-10-CM

## 2015-02-01 DIAGNOSIS — J9601 Acute respiratory failure with hypoxia: Secondary | ICD-10-CM

## 2015-02-01 LAB — BASIC METABOLIC PANEL
ANION GAP: 9 (ref 5–15)
BUN: 13 mg/dL (ref 6–20)
CALCIUM: 7.9 mg/dL — AB (ref 8.9–10.3)
CHLORIDE: 97 mmol/L — AB (ref 101–111)
CO2: 24 mmol/L (ref 22–32)
CREATININE: 1.45 mg/dL — AB (ref 0.44–1.00)
GFR calc non Af Amer: 46 mL/min — ABNORMAL LOW (ref >60)
GFR, EST AFRICAN AMERICAN: 54 mL/min — AB (ref >60)
Glucose, Bld: 110 mg/dL — ABNORMAL HIGH (ref 65–99)
Potassium: 3.2 mmol/L — ABNORMAL LOW (ref 3.5–5.1)
SODIUM: 130 mmol/L — AB (ref 135–145)

## 2015-02-01 LAB — MAGNESIUM: MAGNESIUM: 2.1 mg/dL (ref 1.7–2.4)

## 2015-02-01 LAB — CBC
HEMATOCRIT: 31.4 % — AB (ref 36.0–46.0)
Hemoglobin: 11 g/dL — ABNORMAL LOW (ref 12.0–15.0)
MCH: 30.5 pg (ref 26.0–34.0)
MCHC: 35 g/dL (ref 30.0–36.0)
MCV: 87 fL (ref 78.0–100.0)
PLATELETS: 100 10*3/uL — AB (ref 150–400)
RBC: 3.61 MIL/uL — ABNORMAL LOW (ref 3.87–5.11)
RDW: 15.6 % — ABNORMAL HIGH (ref 11.5–15.5)
WBC: 6 10*3/uL (ref 4.0–10.5)

## 2015-02-01 LAB — POCT I-STAT 3, ART BLOOD GAS (G3+)
ACID-BASE DEFICIT: 1 mmol/L (ref 0.0–2.0)
Bicarbonate: 23.6 mEq/L (ref 20.0–24.0)
O2 SAT: 100 %
Patient temperature: 97.2
TCO2: 25 mmol/L (ref 0–100)
pCO2 arterial: 35.4 mmHg (ref 35.0–45.0)
pH, Arterial: 7.428 (ref 7.350–7.450)
pO2, Arterial: 195 mmHg — ABNORMAL HIGH (ref 80.0–100.0)

## 2015-02-01 LAB — TROPONIN I: Troponin I: <0.03 ng/mL (ref <0.031)

## 2015-02-01 LAB — URINE CULTURE: Colony Count: 90000

## 2015-02-01 LAB — CORTISOL-AM, BLOOD

## 2015-02-01 LAB — AMMONIA: Ammonia: 104 umol/L — ABNORMAL HIGH (ref 9–35)

## 2015-02-01 LAB — GLUCOSE, CAPILLARY
GLUCOSE-CAPILLARY: 120 mg/dL — AB (ref 65–99)
GLUCOSE-CAPILLARY: 84 mg/dL (ref 65–99)
GLUCOSE-CAPILLARY: 78 mg/dL (ref 65–99)
Glucose-Capillary: 109 mg/dL — ABNORMAL HIGH (ref 65–99)
Glucose-Capillary: 105 mg/dL — ABNORMAL HIGH (ref 65–99)

## 2015-02-01 LAB — PHOSPHORUS: PHOSPHORUS: 3.1 mg/dL (ref 2.5–4.6)

## 2015-02-01 MED ORDER — PRO-STAT SUGAR FREE PO LIQD
30.0000 mL | Freq: Two times a day (BID) | ORAL | Status: DC
  Filled 2015-02-01 (×2): qty 30

## 2015-02-01 MED ORDER — ONDANSETRON HCL 4 MG/2ML IJ SOLN
4.0000 mg | Freq: Three times a day (TID) | INTRAMUSCULAR | Status: DC | PRN
  Administered 2015-02-08: 4 mg via INTRAVENOUS
  Filled 2015-02-01: qty 2

## 2015-02-01 MED ORDER — SODIUM CHLORIDE 0.9 % IV BOLUS (SEPSIS)
500.0000 mL | INTRAVENOUS | Status: DC | PRN
  Administered 2015-02-01 – 2015-02-02 (×3): 500 mL via INTRAVENOUS
  Filled 2015-02-01 (×3): qty 500

## 2015-02-01 MED ORDER — NOREPINEPHRINE BITARTRATE 1 MG/ML IV SOLN
0.0000 ug/min | INTRAVENOUS | Status: DC
  Administered 2015-02-01: 5 ug/min via INTRAVENOUS
  Filled 2015-02-01 (×2): qty 16

## 2015-02-01 MED ORDER — POTASSIUM CHLORIDE 20 MEQ/15ML (10%) PO SOLN
40.0000 meq | ORAL | Status: AC
  Administered 2015-02-01 (×2): 40 meq
  Filled 2015-02-01 (×2): qty 30

## 2015-02-01 MED ORDER — PANTOPRAZOLE SODIUM 40 MG PO PACK
40.0000 mg | PACK | Freq: Every day | ORAL | Status: DC
  Administered 2015-02-01 – 2015-02-02 (×2): 40 mg
  Filled 2015-02-01 (×4): qty 20

## 2015-02-01 MED ORDER — PRO-STAT SUGAR FREE PO LIQD
30.0000 mL | Freq: Every day | ORAL | Status: DC
  Administered 2015-02-01 – 2015-02-03 (×3): 30 mL
  Filled 2015-02-01 (×4): qty 30

## 2015-02-01 MED ORDER — POTASSIUM CHLORIDE 20 MEQ/15ML (10%) PO SOLN
40.0000 meq | ORAL | Status: AC
  Administered 2015-02-01 (×2): 40 meq
  Filled 2015-02-01 (×3): qty 30

## 2015-02-01 MED ORDER — VITAL HIGH PROTEIN PO LIQD
1000.0000 mL | ORAL | Status: DC
  Administered 2015-02-01 – 2015-02-02 (×2): 1000 mL
  Filled 2015-02-01 (×6): qty 1000

## 2015-02-01 MED ORDER — DEXMEDETOMIDINE HCL IN NACL 200 MCG/50ML IV SOLN
0.2000 ug/kg/h | INTRAVENOUS | Status: DC
  Administered 2015-02-01: 0.2 ug/kg/h via INTRAVENOUS
  Filled 2015-02-01: qty 50

## 2015-02-01 MED ORDER — LEVOTHYROXINE SODIUM 100 MCG PO TABS
100.0000 ug | ORAL_TABLET | Freq: Every day | ORAL | Status: DC
  Administered 2015-02-02 – 2015-02-03 (×2): 100 ug
  Filled 2015-02-01 (×7): qty 1

## 2015-02-01 MED ORDER — SODIUM CHLORIDE 0.9 % IV SOLN
500.0000 mg | Freq: Two times a day (BID) | INTRAVENOUS | Status: DC
  Administered 2015-02-01 – 2015-02-05 (×9): 500 mg via INTRAVENOUS
  Filled 2015-02-01 (×11): qty 5

## 2015-02-01 MED ORDER — VITAL HIGH PROTEIN PO LIQD
1000.0000 mL | ORAL | Status: DC
  Filled 2015-02-01 (×2): qty 1000

## 2015-02-01 MED ORDER — HYDROCORTISONE NA SUCCINATE PF 100 MG IJ SOLR
50.0000 mg | Freq: Three times a day (TID) | INTRAMUSCULAR | Status: DC
  Administered 2015-02-01 – 2015-02-03 (×6): 50 mg via INTRAVENOUS
  Filled 2015-02-01 (×6): qty 1

## 2015-02-01 NOTE — Progress Notes (Signed)
MEDICATION RELATED CONSULT NOTE - FOLLOW UP   Pharmacy Consult for phenytoin Indication: seizures  No Known Allergies  Patient Measurements: Height: 5\' 4"  (162.6 cm) Weight: 189 lb 2.5 oz (85.8 kg) IBW/kg (Calculated) : 54.7  Vital Signs: Temp: 97 F (36.1 C) (06/15 0802) Temp Source: Oral (06/15 0802) BP: 104/61 mmHg (06/15 0900) Pulse Rate: 47 (06/15 0912) Intake/Output from previous day: 06/14 0701 - 06/15 0700 In: 4952.9 [I.V.:3035.4; NG/GT:60; IV Piggyback:1857.5] Out: 1101 [Urine:1100; Stool:1] Intake/Output from this shift: Total I/O In: 735.7 [I.V.:535.7; IV Piggyback:200] Out: 60 [Urine:60]  Labs:  Recent Labs  01/30/15 1211 01/30/15 1219  01/30/15 1635 01/30/15 2025 01/31/15 0251 02/01/15 0400  WBC  --   --   < >  --  2.3* 5.4 6.0  HGB  --  13.6  < >  --  10.3* 10.7* 11.0*  HCT  --  40.0  < >  --  29.1* 31.2* 31.4*  PLT  --   --   < >  --  117* 123* 100*  CREATININE 0.45 0.60  --   --   --  0.69 1.45*  MG  --   --   --  1.4*  --   --  2.1  PHOS  --   --   --  2.6  --   --  3.1  ALBUMIN 3.0*  --   --   --   --   --   --   PROT 5.8*  --   --   --   --   --   --   AST 27  --   --   --   --   --   --   ALT 23  --   --   --   --   --   --   ALKPHOS 81  --   --   --   --   --   --   BILITOT 0.4  --   --   --   --   --   --   < > = values in this interval not displayed. Estimated Creatinine Clearance: 57.9 mL/min (by C-G formula based on Cr of 1.45).   Microbiology: Recent Results (from the past 720 hour(s))  Urine culture     Status: None   Collection Time: 01/30/15 11:19 AM  Result Value Ref Range Status   Specimen Description URINE, CATHETERIZED  Final   Special Requests NONE  Final   [place] Count   Final    90,000 COLONIES/ML Performed at [facility]    Culture   Final    Multiple bacterial morphotypes present, none predominant. Suggest appropriate recollection if clinically indicated. Performed at [facility]    Report  Status 02/01/2015 FINAL  Final  Blood Culture (routine x 2)     Status: None (Preliminary result)   Collection Time: 01/30/15 12:00 PM  Result Value Ref Range Status   Specimen Description BLOOD RIGHT HAND  Final   Special Requests BOTTLES DRAWN AEROBIC ONLY 5CC  Final   Culture   Final           BLOOD CULTURE RECEIVED NO GROWTH TO DATE CULTURE WILL BE HELD FOR 5 DAYS BEFORE ISSUING A FINAL NEGATIVE REPORT Performed at [facility]    Report Status PENDING  Incomplete  Blood Culture (routine x 2)     Status: None (Preliminary result)   Collection Time: 01/30/15 12:30 PM  Result Value Ref Range Status   Specimen Description BLOOD RIGHT ARM  Final   Special Requests BOTTLES DRAWN AEROBIC ONLY 5CC  Final   Culture   Final           BLOOD CULTURE RECEIVED NO GROWTH TO DATE CULTURE WILL BE HELD FOR 5 DAYS BEFORE ISSUING A FINAL NEGATIVE REPORT Performed at [facility]    Report Status PENDING  Incomplete  MRSA PCR Screening     Status: None   Collection Time: 01/30/15  3:09 PM  Result Value Ref Range Status   MRSA by PCR NEGATIVE NEGATIVE Final    Comment:        The GeneXpert MRSA Assay (FDA approved for NASAL specimens only), is one component of a comprehensive MRSA colonization surveillance program. It is not intended to diagnose MRSA infection nor to guide or monitor treatment for MRSA infections.    Assessment: Margaret Morgan on chronic phenytoin admitted with AMS. A free phenytoin level returned and was elevated at 2.7 (goal 1-2). No new seizure activity per neuro. Of note, valproic acid was discontinued today due to an elevated ammonia level of 104. The combination of valproic acid + phenytoin can increase phenytoin levels which may have contributed to her elevated level. She is also on keppra and propofol for seizures/sedation.    Goal of Therapy:  Free phenytoin level 1-2 Total phenytoin level 10-20  Plan:  - Hold this mornings phenytoin level then resume  previous dose of 75mg  BID  - F/u seizure activity and recheck a level at steady state in 5-7 days  Teneisha Gignac, Rande Lawman 02/01/2015,9:44 AM

## 2015-02-01 NOTE — Care Management Note (Signed)
Case Management Note  Patient Details  Name: Margaret Morgan MRN: [id number] Date of Birth: [birth year]  Subjective/Objective:            From an AL in [place] - No ltach benefits.         Action/Plan:   Expected Discharge Date:                  Expected Discharge Plan:  Assisted Living / Rest Home  In-House Referral:  Clinical Social Work ([place])  Discharge planning Services  CM Consult  Post Acute Care Choice:    Choice offered to:     DME Arranged:    DME Agency:     HH Arranged:    HH Agency:     Status of Service:  In process, will continue to follow  Medicare Important Message Given:    Date Medicare IM Given:    Medicare IM give by:    Date Additional Medicare IM Given:    Additional Medicare Important Message give by:     If discussed at [place] of Stay Meetings, dates discussed:    Additional Comments:  Vergie Living, RN 02/01/2015, 11:59 AM

## 2015-02-01 NOTE — Progress Notes (Signed)
Moved pts ET tube 3 cm out per MD order per Xray.  Secured now at 20 lip. No issues to report.

## 2015-02-01 NOTE — Progress Notes (Signed)
eLink Physician-Brief Progress Note Patient Name: Margaret Morgan DOB: Nov 22, 1980 MRN: [id number]   Date of Service  02/01/2015  HPI/Events of Note  Diarrhea.  eICU Interventions  Will order Flexiseal.     Intervention Category Minor Interventions: Routine modifications to care plan (e.g. PRN medications for pain, fever)  Jaquel Glassburn [place] 02/01/2015, 6:11 PM

## 2015-02-01 NOTE — Progress Notes (Addendum)
PULMONARY / CRITICAL CARE MEDICINE   Name: Margaret Morgan MRN: [id number] DOB: Apr 01, 1981    ADMISSION DATE:  01/30/2015 CONSULTATION DATE:  01/31/2015  REFERRING MD :  Dareen Piano  CHIEF COMPLAINT:  AMS  INITIAL PRESENTATION: 34 year old female with intellectual disability admitted 6/13 for AMS and presumed sepsis, which worsened despite broad spectrum antibiotics,  IVF resuscitation, and low dose stress steroids. AMS worsening, PMS consulted.   STUDIES:  6/13 CT head > No acute abnormality. 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 The patient has a left mastoid effusion which is new since the comparison examination.  SIGNIFICANT EVENTS:    SUBJECTIVE:  RASS 0 to -1. Not F/C  VITAL SIGNS: Temp:  [97 F (36.1 C)-97.4 F (36.3 C)] 97 F (36.1 C) (06/15 1130) Pulse Rate:  [25-138] 45 (06/15 1500) Resp:  [10-26] 17 (06/15 1500) BP: (68-148)/(27-98) 77/35 mmHg (06/15 1500) SpO2:  [91 %-100 %] 100 % (06/15 1500) FiO2 (%):  [40 %-100 %] 40 % (06/15 1200) Weight:  [85.8 kg (189 lb 2.5 oz)] 85.8 kg (189 lb 2.5 oz) (06/15 0444) HEMODYNAMICS: CVP:  [3 mmHg-8 mmHg] 6 mmHg VENTILATOR SETTINGS: Vent Mode:  [-] PCV FiO2 (%):  [40 %-100 %] 40 % Set Rate:  [14 bmp-15 bmp] 15 bmp Vt Set:  [440 mL] 440 mL PEEP:  [5 cmH20] 5 cmH20 Plateau Pressure:  [17 cmH20-20 cmH20] 18 cmH20 INTAKE / OUTPUT:  Intake/Output Summary (Last 24 hours) at 02/01/15 1534 Last data filed at 02/01/15 1500  Gross per 24 hour  Intake 5148.61 ml  Output   1111 ml  Net 4037.61 ml    PHYSICAL EXAMINATION: General: RASS -1, not F/C Neuro:  MAEs, no focal deficits HEENT: [place]/AT Cardiovascular: reg, brady, no M Lungs:  Clear Abdomen:  Soft, NT, ND and +BS. Ext: warm, no edema  LABS:  CBC  Recent Labs Lab 01/30/15 2025 01/31/15 0251 02/01/15 0400  WBC 2.3* 5.4 6.0  HGB 10.3* 10.7* 11.0*  HCT 29.1* 31.2* 31.4*  PLT 117* 123* 100*    Coag's  Recent Labs Lab 01/30/15 2025  INR 1.23   BMET  Recent Labs Lab 01/30/15 1211 01/30/15 1219 01/31/15 0251 02/01/15 0400  NA 131* 129* 130* 130*  K 4.2 4.1 4.6 3.2*  CL 92* 91* 96* 97*  CO2 29  --  25 24  BUN 14 18 11 13   CREATININE 0.45 0.60 0.69 1.45*  GLUCOSE 115* 127* 115* 110*   Electrolytes  Recent Labs Lab 01/30/15 1211 01/30/15 1635 01/31/15 0251 02/01/15 0400  CALCIUM 8.7*  --  7.9* 7.9*  MG  --  1.4*  --  2.1  PHOS  --  2.6  --  3.1   Sepsis Markers  Recent Labs Lab 01/30/15 1219 01/30/15 1635 01/31/15 1300  LATICACIDVEN 2.94* 1.0  --   PROCALCITON  --   --  <0.10   ABG  Recent Labs Lab 01/31/15 0947 01/31/15 2357 02/01/15 0504  PHART 7.449 7.480* 7.428  PCO2ART 39.5 31.7* 35.4  PO2ART 84.4 139.0* 195.0*   Liver Enzymes  Recent Labs Lab 01/30/15 1211  AST 27  ALT 23  ALKPHOS 81  BILITOT 0.4  ALBUMIN 3.0*   Cardiac Enzymes  Recent Labs Lab 01/31/15 1835 02/01/15 0001 02/01/15 0400  TROPONINI <0.03 <0.03 <0.03   Glucose  Recent Labs Lab 01/31/15 1520 01/31/15 1802 01/31/15 2048 01/31/15 2324 02/01/15 0800 02/01/15 1126  GLUCAP 105* 113* 90  120* 84 109*    Imaging [place] Art/ven Flow Abd Pelv Doppler Limited  02/01/2015   CLINICAL DATA:  Liver failure.  EXAM: US ABDOMEN LIMITED - RIGHT UPPER QUADRANT  COMPARISON:  None.  FINDINGS: Gallbladder:  No gallstones or wall thickening visualized. No sonographic Murphy sign noted.  Common bile duct:  Diameter: 4.6 mm  Liver:  No focal lesion identified. Within normal limits in parenchymal echogenicity. Mild ascites and mild pleural effusions.  IMPRESSION: Mild ascites and bilateral effusions, otherwise negative exam.   Electronically Signed   By: Marcello Moores  Register   On: 02/01/2015 12:24   Dg Chest Port 1 View  02/01/2015   CLINICAL DATA:  Intubation.  EXAM: PORTABLE CHEST - 1 VIEW  COMPARISON:  None.  FINDINGS: Endotracheal tube, NG tube, left IJ line stable position.  Mediastinum hilar structures are stable. Low lung volumes with mild left base subsegmental atelectasis again noted. Heart size normal. No pleural effusion or pneumothorax.  IMPRESSION: Lines and tubes in stable position. Low lung volumes with mild left base subsegmental atelectasis again noted.   Electronically Signed   By: Marcello Moores  Register   On: 02/01/2015 07:06   Dg Chest Port 1 View  01/31/2015   CLINICAL DATA:  Hypoxia  EXAM: PORTABLE CHEST - 1 VIEW  COMPARISON:  Study obtained earlier in the day  FINDINGS: Endotracheal tube tip is at the carina. Nasogastric tube tip and side port are in the stomach. Central catheter tip is at the cavoatrial junction. No pneumothorax. There is new left base atelectasis. Lungs are otherwise clear. Heart size and pulmonary vascularity are normal.  IMPRESSION: Tube and catheter positions as described without pneumothorax. Note that the endotracheal tube tip is at the carina. There is new left base atelectasis.  These results were called by telephone at the time of interpretation on 01/31/2015 at 7:01 pm to Brunetta Genera, RN, who verbally acknowledged these results.   Electronically Signed   By: Lowella Grip III M.D.   On: 01/31/2015 19:02   US Abdomen Limited Ruq  02/01/2015   CLINICAL DATA:  Liver failure.  EXAM: US ABDOMEN LIMITED - RIGHT UPPER QUADRANT  COMPARISON:  None.  FINDINGS: Gallbladder:  No gallstones or wall thickening visualized. No sonographic Murphy sign noted.  Common bile duct:  Diameter: 4.6 mm  Liver:  No focal lesion identified. Within normal limits in parenchymal echogenicity. Mild ascites and mild pleural effusions.  IMPRESSION: Mild ascites and bilateral effusions, otherwise negative exam.   Electronically Signed   By: Marcello Moores  Register   On: 02/01/2015 12:24    ASSESSMENT / PLAN:  PULMONARY A:  VDRF, intubated predominantly due to AMS P:   Cont full support - settings reviewed and/or adjusted Cont vent bundle Daily SBT if/when meets  criteria  CARDIOVASCULAR CVL [facility] 6/13 >>  A:  Hypotension, likely related to adrenal insufficiency. P:  Cont HC CVP goal 10-14  NS boluses as needed MAP goal > 60 mmHg  NE as needed  RENAL A:   Hyponatremia, mild Hypokalemia P:   Monitor BMET intermittently Monitor I/Os Correct electrolytes as indicated IVFs adjusted  GASTROINTESTINAL A:   No acute issues P:   SUP: enteral PPI Begin TFs 6/15  HEMATOLOGIC A:  Thrombocytopenia, uncertain chronicity P:  DVT px: SCDs ([facility] DC'd 6/15) Monitor CBC intermittently Transfuse per usual ICU guidelines  INFECTIOUS A:   No evidence of infection Normal PCT P:   DC Vanc 6/15 Dc Zosyn 6/15 Monitor temp, WBC count Follow up  micro results to completion  ENDOCRINE A:   Panhypopituitarism  P:   Cont desmopressin Cont stress dose steroids Change L thyroxine to per tube 6/15  NEUROLOGIC A:   Baseline cognitive impairment ICU/vent associated discomfort Acute encephalopathy Hyperammonemia - unclar etiology Intermittent agitation P:   Did not tolerated dexmedetomidine 6/15 due to bradycardia and hypotension Cont low dose PRN fentanyl Daily [facility] Neurology following Cont lactulose  No family available  CCM X 40 mins   Merton Border, MD ; [facility] service Mobile [phone].  After 5:30 PM or weekends, call [phone]

## 2015-02-01 NOTE — Progress Notes (Signed)
Nutrition Follow-up / Consult  DOCUMENTATION CODES:  Obesity unspecified  INTERVENTION:   Initiate TF via NGT with Vital High Protein at 25 ml/h and Prostat 30 ml once daily on day 1; on day 2, increase to goal rate of 45 ml/h (1080 ml per day) to provide 1180 kcals, 110 gm protein, 903 ml free water daily.  NUTRITION DIAGNOSIS:  Inadequate oral intake related to inability to eat as evidenced by NPO status.  Ongoing  GOAL:  Provide needs based on ASPEN/SCCM guidelines  Unmet  MONITOR:  TF tolerance, Weight trends, Labs, Vent status  REASON FOR ASSESSMENT:  Consult Enteral/tube feeding initiation and management  ASSESSMENT:  34 yo female with hx of Mental retardation, hypothyroidism, seizures, brain neoplasm (RAF-HCC) admitted on 6/13 from nursing home ([place]), with AMS and also concerning for sepsis.  Required intubation on 6/14 for suspected aspiration. Suspect current weight is above usual weight due to edema. Unsure of usual weight. Labs reviewed: sodium and potassium are low; phosphorus and magnesium WNL.  Patient is currently intubated on ventilator support, NGT in place for feedings. MV: 7.2 L/min Temp (24hrs), Avg:97.4 F (36.3 C), Min:97 F (36.1 C), Max:98.7 F (37.1 C)  Propofol: off (changed to precedex)  Height:  Ht Readings from Last 1 Encounters:  01/30/15 5\' 4"  (1.626 m)    Weight:  Wt Readings from Last 1 Encounters:  02/01/15 189 lb 2.5 oz (85.8 kg)    Ideal Body Weight:  54.5 kg  BMI:  Body mass index is 32.45 kg/(m^2).  Estimated Nutritional Needs:  Kcal:  315-4008  Protein:  109 gm  Fluid:  1.5 L  Skin:  Wound (see comment) (stage 1 to left heel, MASD to buttocks)  Diet Order:   NPO  EDUCATION NEEDS:  No education needs identified at this time   Intake/Output Summary (Last 24 hours) at 02/01/15 1234 Last data filed at 02/01/15 1100  Gross per 24 hour  Intake 5673.61 ml  Output   1031 ml  Net 4642.61 ml     Last BM:  6/15   Molli Barrows, RD, LDN, [place] Pager [phone] After Hours Pager [phone]

## 2015-02-01 NOTE — Progress Notes (Signed)
Patient hypotensive and bradycardic after precedex drip started. Precedex drip stopped and patient is still arousable with stimulation. Dr. Leonidas Romberg aware. Will wait for recovery since medication is stopped. Levophed drip started. Vitals signs now more stable. Will continue to monitor. Sandre Kitty

## 2015-02-01 NOTE — Progress Notes (Signed)
Subjective: Intubated due to suspected aspiration.   Exam: Filed Vitals:   02/01/15 0802  BP:   Pulse:   Temp: 97 F (36.1 C)  Resp:    Gen: In bed, NAD MS: Awakens to noxious stimuli, does not follow commands.  VE:LFYBO, eyes conjugate, clinks to eyelid stim bilaterally, no blink to threat Motor: localizes bilaterally Sensory:as above.   Myoclonus appears much improved.   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 I continue to suspect a metabolic cause, be it adrenal insufficiency, hypothyroidsism.  TSH was low, but in the setting of pituitary dysfunction I do not think checking this lab is helpful. T4 is borderline low.   An ammonia of 175 could certainly be contributory and I would favor discontinuing depakote and will start keppra in lieu of this.   Recommendations: 1) discontinue depakote 2) start keppra 500mg  BID 3) continue dilantin(levels will need to be followed given that depakote is an inhibitor) 4) will continue to follow.  4)   Margaret Rack, MD [facility] [phone]  If 7pm- 7am, please page neurology on call as listed in [place].

## 2015-02-01 NOTE — Progress Notes (Signed)
SLP Cancellation Note  Patient Details Name: Margaret Morgan MRN: [id number] DOB: Dec 31, 1980   Cancelled treatment:       Reason Eval/Treat Not Completed: Patient not medically ready. Pt intubated, will sign off. Please reorder when appropriate.    Arnetia Bronk, Katherene Ponto 02/01/2015, 7:50 AM

## 2015-02-01 NOTE — Progress Notes (Signed)
eLink Physician-Brief Progress Note Patient Name: Margaret Morgan DOB: [birth year] MRN: [id number]   Date of Service  02/01/2015  HPI/Events of Note  N/V  eICU Interventions  PRN Zofran ordered     Intervention Category Minor Interventions: Routine modifications to care plan (e.g. PRN medications for pain, fever)  [place] 02/01/2015, 3:10 AM

## 2015-02-01 NOTE — Clinical Social Work Note (Signed)
Clinical Social Work Assessment  Patient Details  Name: Margaret Morgan MRN: [id number] Date of Birth: [birth year]  Date of referral:  02/01/15               Reason for consult:  Facility Placement, Discharge Planning                Permission sought to share information with:  Case Manager, Facility Sport and exercise psychologist, Family Supports, PCP Permission granted to share information::  Yes, Verbal Permission Granted  Name::      Margaret Como Staples )  Agency::   ([facility] vs SNF's )  Relationship::   (Brother )  Contact Information:   [phone])  Housing/Transportation Living arrangements for the past 2 months:  Margaret Morgan of Information:   (Sibling ) Patient Interpreter Needed:  None Criminal Activity/Legal Involvement Pertinent to Current Situation/Hospitalization:  No - Comment as needed Significant Relationships:  Siblings Lives with:  Facility Resident Do you feel safe going back to the place where you live?  Yes Need for family participation in patient care:  Yes (Comment)  Care giving concerns:  No care giving concerns reported at this time.    Social Worker assessment / plan:  Holiday representative spoke with patient's brother, Margaret Morgan in reference to post-acute placement/pt's return to ALF. CSW introduced CSW role and discharge planning process. Pt's brother reported pt has been a resident of [facility] for the past 5 years. Pt's brother does live in the [place] area. Pt's brother further reported he is pleased with the care at [facility] and wishes for pt to return once medically stable. CSW presented SNF placement with pt's brother in the event pt requires a higher level of care/ short-term rehab and pt's brother is agreeable to SNF placement. CSW explained SNF process. Pt has h/o mental retardation and currently intubated. No further concerns reported by pt's brother at this time. CSW encouraged pt's brother to contact CSW as  needed. CSW to update FL-2 as needed. CSW will continue to follow pt and pt's family for continued support and to facilitate pt's discharge needs once medically stable.   Employment status:  Disabled (Comment on whether or not currently receiving Disability) Insurance information:    PT Recommendations:  Not assessed at this time Information / Referral to community resources:  [place]  Patient/Family's Response to care:  Pt currently intubated/on ventilator. Pt's brother agreeable to pt returning to ALF, [facility] if appropriate or SNF placement if requiring a higher level of care/ short-term rehab. Pt's brother pleasant, accepting and appreciated social work intervention.   Patient/Family's Understanding of and Emotional Response to Diagnosis, Current Treatment, and Prognosis:  Pt's brother understands that if pt is not appropriate to return to ALF that disposition will likely be SNF. CSW remains available.    Emotional Assessment Appearance:  Appears stated age Attitude/Demeanor/Rapport:  Unable to Assess Affect (typically observed):  Unable to Assess Orientation:  Oriented to Self Alcohol / Substance use:  Never Used Psych involvement (Current and /or in the community):  No (Comment)  Discharge Needs  Concerns to be addressed:  No discharge needs identified Readmission within the last 30 days:  No Current discharge risk:  None Barriers to Discharge:  No Barriers Identified   Glendon Axe, MSW, LCSWA [phone] 02/01/2015 11:49 AM

## 2015-02-01 NOTE — Progress Notes (Signed)
eLink Physician-Brief Progress Note Patient Name: Margaret Morgan DOB: 10/14/1980 MRN: [id number]   Date of Service  02/01/2015  HPI/Events of Note  Hypokalemia  eICU Interventions  Potassium replaced     Intervention Category Intermediate Interventions: Electrolyte abnormality - evaluation and management  Zaiden Ludlum 02/01/2015, 5:50 AM

## 2015-02-02 ENCOUNTER — Inpatient Hospital Stay (HOSPITAL_COMMUNITY): Payer: Medicare Other

## 2015-02-02 DIAGNOSIS — J690 Pneumonitis due to inhalation of food and vomit: Secondary | ICD-10-CM

## 2015-02-02 DIAGNOSIS — R0902 Hypoxemia: Secondary | ICD-10-CM

## 2015-02-02 LAB — CBC
HCT: 29.2 % — ABNORMAL LOW (ref 36.0–46.0)
Hemoglobin: 10 g/dL — ABNORMAL LOW (ref 12.0–15.0)
MCH: 30.1 pg (ref 26.0–34.0)
MCHC: 34.2 g/dL (ref 30.0–36.0)
MCV: 88 fL (ref 78.0–100.0)
Platelets: 144 10*3/uL — ABNORMAL LOW (ref 150–400)
RBC: 3.32 MIL/uL — ABNORMAL LOW (ref 3.87–5.11)
RDW: 16 % — ABNORMAL HIGH (ref 11.5–15.5)
WBC: 9.9 10*3/uL (ref 4.0–10.5)

## 2015-02-02 LAB — BASIC METABOLIC PANEL
ANION GAP: 9 (ref 5–15)
BUN: 21 mg/dL — ABNORMAL HIGH (ref 6–20)
CHLORIDE: 102 mmol/L (ref 101–111)
CO2: 24 mmol/L (ref 22–32)
Calcium: 8.1 mg/dL — ABNORMAL LOW (ref 8.9–10.3)
Creatinine, Ser: 1.73 mg/dL — ABNORMAL HIGH (ref 0.44–1.00)
GFR calc non Af Amer: 37 mL/min — ABNORMAL LOW (ref >60)
GFR, EST AFRICAN AMERICAN: 43 mL/min — AB (ref >60)
Glucose, Bld: 90 mg/dL (ref 65–99)
POTASSIUM: 5.5 mmol/L — AB (ref 3.5–5.1)
Sodium: 135 mmol/L (ref 135–145)

## 2015-02-02 LAB — GLUCOSE, CAPILLARY
GLUCOSE-CAPILLARY: 107 mg/dL — AB (ref 65–99)
GLUCOSE-CAPILLARY: 69 mg/dL (ref 65–99)
GLUCOSE-CAPILLARY: 79 mg/dL (ref 65–99)
GLUCOSE-CAPILLARY: 87 mg/dL (ref 65–99)
GLUCOSE-CAPILLARY: 96 mg/dL (ref 65–99)
Glucose-Capillary: 64 mg/dL — ABNORMAL LOW (ref 65–99)
Glucose-Capillary: 70 mg/dL (ref 65–99)
Glucose-Capillary: 77 mg/dL (ref 65–99)
Glucose-Capillary: 88 mg/dL (ref 65–99)
Glucose-Capillary: 91 mg/dL (ref 65–99)

## 2015-02-02 LAB — MAGNESIUM: Magnesium: 2 mg/dL (ref 1.7–2.4)

## 2015-02-02 LAB — PHOSPHORUS: Phosphorus: 4.8 mg/dL — ABNORMAL HIGH (ref 2.5–4.6)

## 2015-02-02 LAB — AMMONIA: AMMONIA: 68 umol/L — AB (ref 9–35)

## 2015-02-02 LAB — T3: T3, Total: 33 ng/dL — ABNORMAL LOW (ref 71–180)

## 2015-02-02 MED ORDER — DEXTROSE 50 % IV SOLN
INTRAVENOUS | Status: AC
  Filled 2015-02-02: qty 50

## 2015-02-02 NOTE — Procedures (Signed)
Extubation Procedure Note  Patient Details:   Name: Margaret Morgan DOB: [birth year] MRN: [id number]   Airway Documentation:     Evaluation  O2 sats: stable throughout Complications: No apparent complications Patient did tolerate procedure well. Bilateral Breath Sounds: Rhonchi Suctioning: Oral, Airway No  Pt was extubated to 3L/[place]. Pt tolerate fairly well. Pt appeared anxious and was biting the tube. Pt was not able to speak post-extubation, however unable to determine pt baseline. Pt was coughing but unable to suction out secretions. Pt swallowed the secretions. NTS PRN due to increase secretions and pt not able to clear them. RT will continue to monitor due to possible risk for re-intubation.   Laymond Purser M 02/02/2015, 11:12 AM

## 2015-02-02 NOTE — Progress Notes (Addendum)
PULMONARY / CRITICAL CARE MEDICINE   Name: Margaret Morgan MRN: [id number] DOB: September 07, 1980    ADMISSION DATE:  01/30/2015 CONSULTATION DATE:  01/31/2015  REFERRING MD :  Dareen Piano  CHIEF COMPLAINT:  AMS  INITIAL PRESENTATION: 34 year old female with intellectual disability admitted 6/13 for AMS and presumed sepsis, which worsened despite broad spectrum antibiotics,  IVF resuscitation, and low dose stress steroids. AMS worsening, PMS consulted.   STUDIES:  6/13 CT head > No acute abnormality. 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 The patient has a left mastoid effusion which is new since the comparison examination.     SUBJECTIVE:  RASS 0. + F/C,. No distress  VITAL SIGNS: Temp:  [93.3 F (34.1 C)-99.9 F (37.7 C)] 98.9 F (37.2 C) (06/16 1201) Pulse Rate:  [43-93] 77 (06/16 1200) Resp:  [14-40] 22 (06/16 1200) BP: (63-167)/(42-123) 107/58 mmHg (06/16 1200) SpO2:  [91 %-100 %] 98 % (06/16 1200) FiO2 (%):  [40 %] 40 % (06/16 0814) Weight:  [88.4 kg (194 lb 14.2 oz)] 88.4 kg (194 lb 14.2 oz) (06/16 0450) HEMODYNAMICS: CVP:  [6 mmHg-10 mmHg] 8 mmHg VENTILATOR SETTINGS: Vent Mode:  [-] PCV FiO2 (%):  [40 %] 40 % Set Rate:  [15 bmp] 15 bmp PEEP:  [5 cmH20] 5 cmH20 Plateau Pressure:  [11 cmH20-16 cmH20] 11 cmH20 INTAKE / OUTPUT:  Intake/Output Summary (Last 24 hours) at 02/02/15 1525 Last data filed at 02/02/15 1200  Gross per 24 hour  Intake 2609.07 ml  Output   1285 ml  Net 1324.07 ml    PHYSICAL EXAMINATION: General: RASS 0, + F/C Neuro:  MAEs, no focal deficits HEENT: [place]/AT Cardiovascular: reg, brady, no M Lungs:  Clear Abdomen:  Soft, NT, ND and +BS. Ext: warm, no edema  LABS:  CBC  Recent Labs Lab 01/31/15 0251 02/01/15 0400 02/02/15 0500  WBC 5.4 6.0 9.9  HGB 10.7* 11.0* 10.0*  HCT 31.2* 31.4* 29.2*  PLT 123* 100* 144*   Coag's  Recent Labs Lab 01/30/15 2025  INR 1.23    BMET  Recent Labs Lab 01/31/15 0251 02/01/15 0400 02/02/15 0500  NA 130* 130* 135  K 4.6 3.2* 5.5*  CL 96* 97* 102  CO2 25 24 24   BUN 11 13 21*  CREATININE 0.69 1.45* 1.73*  GLUCOSE 115* 110* 90   Electrolytes  Recent Labs Lab 01/30/15 1635 01/31/15 0251 02/01/15 0400 02/02/15 0500  CALCIUM  --  7.9* 7.9* 8.1*  MG 1.4*  --  2.1 2.0  PHOS 2.6  --  3.1 4.8*   Sepsis Markers  Recent Labs Lab 01/30/15 1219 01/30/15 1635 01/31/15 1300  LATICACIDVEN 2.94* 1.0  --   PROCALCITON  --   --  <0.10   ABG  Recent Labs Lab 01/31/15 0947 01/31/15 2357 02/01/15 0504  PHART 7.449 7.480* 7.428  PCO2ART 39.5 31.7* 35.4  PO2ART 84.4 139.0* 195.0*   Liver Enzymes  Recent Labs Lab 01/30/15 1211  AST 27  ALT 23  ALKPHOS 81  BILITOT 0.4  ALBUMIN 3.0*   Cardiac Enzymes  Recent Labs Lab 01/31/15 1835 02/01/15 0001 02/01/15 0400  TROPONINI <0.03 <0.03 <0.03   Glucose  Recent Labs Lab 02/01/15 1607 02/01/15 2044 02/01/15 2342 02/02/15 0441 02/02/15 0847 02/02/15 1200  GLUCAP 78 105* 107* 87 96 79    Imaging Dg Chest Port 1 View  02/02/2015   CLINICAL DATA:  Respiratory failure.  EXAM: PORTABLE CHEST - 1  VIEW  COMPARISON:  02/01/2015.  FINDINGS: Endotracheal tube tip 1.7 cm above the carina. NG tube coiled in stomach. Left IJ line in stable position with tip at the cavoatrial junction. Heart size normal. Low lung volumes with right base subsegmental atelectasis and/or mild infiltrate. No pleural effusion or pneumothorax.  IMPRESSION: 1. Endotracheal tube tip 1.7 cm above the carina. NG tube and left IJ line in stable position. 2. Low lung volumes with mild right base subsegmental atelectasis and or infiltrate .   Electronically Signed   By: Marcello Moores  Register   On: 02/02/2015 07:16    ASSESSMENT / PLAN:  PULMONARY A:  VDRF, resolved P:   Cont supp O2 as needed  CARDIOVASCULAR CVL L IJ 6/13 >> 6/17 A:  Hypotension, resolved P:  Monitor    RENAL A:   Hyponatremia, resolved Hypokalemia, resolved P:   Monitor BMET intermittently Monitor I/Os Correct electrolytes as indicated  GASTROINTESTINAL A:   Concern for post-extubation dysphagia P:   SUP: enteral PPI Cont TFs  SLP eval  HEMATOLOGIC A:  Thrombocytopenia, uncertain chronicity P:  DVT px: SCDs ([facility] DC'd 6/15) Monitor CBC intermittently Transfuse per usual guidelines  INFECTIOUS A:   No evidence of infection Normal PCT P:   Monitor temp, WBC count Follow up micro results to completion  ENDOCRINE A:   Panhypopituitarism  P:   Cont desmopressin Taper stress dose steroids Cont L thyroxine  NEUROLOGIC A:   Baseline cognitive impairment Acute encephalopathy, much improved to resolved Hyperammonemia - unclar etiology Intermittent agitation, controlled P:   Neurology following Cont lactulose  Sister in law updated @ bedside.  Discussed with housestaff. Transfer out of ICU to med-surg. IMTS will assume rimary duties and PCCM will sign off  Merton Border, MD ; [facility] [phone].  After 5:30 PM or weekends, call [phone]

## 2015-02-02 NOTE — Progress Notes (Signed)
Pt's sister-in-law came to check on the patient. She stated that her husband (Micheal Baudoin) would like an update about the patient's diagnosis and plan of care. MD is information of their request.

## 2015-02-02 NOTE — Progress Notes (Signed)
Subjective: Appears to be having some improvement.   Exam: Filed Vitals:   02/02/15 0853  BP:   Pulse:   Temp: 98.6 F (37 C)  Resp:    Gen: In bed, NAD MS: Awakens to noxious stimuli, does not follow commands.  VA:POLID, eyes conjugate, clinks to eyelid stim bilaterally, no blink to threat Motor: localizes bilaterally Sensory:as above.   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 I continue to suspect a metabolic cause, be it adrenal insufficiency, hypothyroidsism.  An ammonia of 030 could certainly be contributory and I would favor discontinuing depakote and will start keppra in lieu of this.   This would not explain her hypothermia, hypotentions, etc. Which would be better explaind by AI.   Recommendations: 1) start keppra 500mg  BID 2) continue dilantin(levels will need to be followed given that depakote is an inhibitor) 3) will continue to follow.    Roland Rack, MD [facility] [phone]  If 7pm- 7am, please page neurology on call as listed in [place].

## 2015-02-02 NOTE — Progress Notes (Signed)
Desmopressin: when med is scanned, Epic does not recognize med as the correct med. Information on the medication bottle matches the information in EPIC. Med verified with the unit pharmacist who confirms that it is the correct medication.

## 2015-02-03 LAB — BASIC METABOLIC PANEL
ANION GAP: 5 (ref 5–15)
BUN: 36 mg/dL — AB (ref 6–20)
CHLORIDE: 110 mmol/L (ref 101–111)
CO2: 25 mmol/L (ref 22–32)
CREATININE: 2 mg/dL — AB (ref 0.44–1.00)
Calcium: 8.3 mg/dL — ABNORMAL LOW (ref 8.9–10.3)
GFR calc Af Amer: 36 mL/min — ABNORMAL LOW (ref >60)
GFR calc non Af Amer: 31 mL/min — ABNORMAL LOW (ref >60)
Glucose, Bld: 95 mg/dL (ref 65–99)
Potassium: 4.4 mmol/L (ref 3.5–5.1)
SODIUM: 140 mmol/L (ref 135–145)

## 2015-02-03 LAB — URINALYSIS, ROUTINE W REFLEX MICROSCOPIC
Bilirubin Urine: NEGATIVE
GLUCOSE, UA: NEGATIVE mg/dL
Hgb urine dipstick: NEGATIVE
KETONES UR: NEGATIVE mg/dL
Nitrite: NEGATIVE
Protein, ur: NEGATIVE mg/dL
Specific Gravity, Urine: 1.01 (ref 1.005–1.030)
Urobilinogen, UA: 0.2 mg/dL (ref 0.0–1.0)
pH: 5 (ref 5.0–8.0)

## 2015-02-03 LAB — CBC
HEMATOCRIT: 27.3 % — AB (ref 36.0–46.0)
HEMOGLOBIN: 9.1 g/dL — AB (ref 12.0–15.0)
MCH: 30.4 pg (ref 26.0–34.0)
MCHC: 33.3 g/dL (ref 30.0–36.0)
MCV: 91.3 fL (ref 78.0–100.0)
Platelets: 91 10*3/uL — ABNORMAL LOW (ref 150–400)
RBC: 2.99 MIL/uL — ABNORMAL LOW (ref 3.87–5.11)
RDW: 16.6 % — ABNORMAL HIGH (ref 11.5–15.5)
WBC: 6.8 10*3/uL (ref 4.0–10.5)

## 2015-02-03 LAB — URINE MICROSCOPIC-ADD ON

## 2015-02-03 LAB — GLUCOSE, CAPILLARY
GLUCOSE-CAPILLARY: 75 mg/dL (ref 65–99)
GLUCOSE-CAPILLARY: 91 mg/dL (ref 65–99)
Glucose-Capillary: 85 mg/dL (ref 65–99)
Glucose-Capillary: 97 mg/dL (ref 65–99)

## 2015-02-03 LAB — CREATININE, URINE, RANDOM: CREATININE, URINE: 32.01 mg/dL

## 2015-02-03 LAB — SODIUM, URINE, RANDOM: Sodium, Ur: 74 mmol/L

## 2015-02-03 LAB — MAGNESIUM: Magnesium: 2 mg/dL (ref 1.7–2.4)

## 2015-02-03 LAB — PHOSPHORUS: Phosphorus: 5.1 mg/dL — ABNORMAL HIGH (ref 2.5–4.6)

## 2015-02-03 MED ORDER — FREE WATER
200.0000 mL | Freq: Three times a day (TID) | Status: DC
  Administered 2015-02-03 – 2015-02-05 (×6): 200 mL

## 2015-02-03 MED ORDER — SODIUM CHLORIDE 0.9 % IV SOLN
INTRAVENOUS | Status: AC
  Administered 2015-02-03: 19:00:00 via INTRAVENOUS

## 2015-02-03 MED ORDER — HYDROCORTISONE NA SUCCINATE PF 100 MG IJ SOLR
25.0000 mg | Freq: Two times a day (BID) | INTRAMUSCULAR | Status: DC
  Administered 2015-02-03 – 2015-02-04 (×2): 25 mg via INTRAVENOUS
  Filled 2015-02-03 (×3): qty 0.5

## 2015-02-03 MED ORDER — CETYLPYRIDINIUM CHLORIDE 0.05 % MT LIQD
7.0000 mL | Freq: Two times a day (BID) | OROMUCOSAL | Status: DC
  Administered 2015-02-03 – 2015-02-08 (×11): 7 mL via OROMUCOSAL

## 2015-02-03 MED ORDER — PRO-STAT 64 PO LIQD
30.0000 mL | Freq: Every day | ORAL | Status: DC
  Administered 2015-02-03 – 2015-02-04 (×2): 30 mL
  Filled 2015-02-03 (×4): qty 30

## 2015-02-03 MED ORDER — OSMOLITE 1.2 CAL PO LIQD
1000.0000 mL | ORAL | Status: DC
  Administered 2015-02-03 – 2015-02-04 (×2): 1000 mL
  Filled 2015-02-03 (×6): qty 1000

## 2015-02-03 MED ORDER — ENOXAPARIN SODIUM 40 MG/0.4ML ~~LOC~~ SOLN
40.0000 mg | SUBCUTANEOUS | Status: DC
  Administered 2015-02-03 – 2015-02-04 (×2): 40 mg via SUBCUTANEOUS
  Filled 2015-02-03 (×3): qty 0.4

## 2015-02-03 NOTE — Consult Note (Signed)
PHARMACY NOTE  Pharmacy Consult :  34 y.o. female admitted with hypotension, bradycardia, hypothermia, AMS,  And hyponatremia likely 2/2 to Adrenal crisis.  Lovenox to be started per Pharmacy Consult  Indication :  VTE prophylaxis   Lovenox Dosing Wt :  86 kg  LABS :  Recent Labs  02/01/15 0400 02/02/15 0500 02/03/15 0410  HGB 11.0* 10.0* 9.1*  HCT 31.4* 29.2* 27.3*  PLT 100* 144* 91*  CREATININE 1.45* 1.73* 2.00*    MEDICATION: Medication PTA: Prescriptions prior to admission  Medication Sig Dispense Refill Last Dose  . albuterol (PROVENTIL HFA;VENTOLIN HFA) 108 (90 BASE) MCG/ACT inhaler Inhale 1-2 puffs into the lungs every 6 (six) hours as needed for wheezing or shortness of breath.   unknown  . Calcium Carb-Cholecalciferol (CALCIUM 600 + D) 600-200 MG-UNIT TABS Take 1 tablet by mouth daily.   01/29/2015 at Unknown time  . desmopressin (DDAVP) 0.1 MG tablet Take 0.1 mg by mouth daily.   01/29/2015 at Unknown time  . divalproex (DEPAKOTE) 500 MG DR tablet Take 500 mg by mouth 4 (four) times daily.   01/29/2015 at Unknown time  . hydrocortisone (CORTEF) 10 MG tablet Take 10 mg by mouth 3 (three) times daily.   01/29/2015 at Unknown time  . levothyroxine (SYNTHROID) 75 MCG tablet Take 75 mcg by mouth daily before breakfast.   01/29/2015 at Unknown time  . LORazepam (ATIVAN) 1 MG tablet Take 0.5-1 mg by mouth 3 (three) times daily. 1mg  at 8am and 8pm, 0.5mg  at 1pm   01/29/2015 at Unknown time  . magnesium oxide (MAG-OX) 400 MG tablet Take 400 mg by mouth 2 (two) times daily.   01/29/2015 at Unknown time  . Omega-3 Fatty Acids (FISH OIL) 1000 MG CAPS Take 1,000 mg by mouth 2 (two) times daily.   01/29/2015 at Unknown time  . pantoprazole (PROTONIX) 40 MG tablet Take 40 mg by mouth daily.   01/29/2015 at Unknown time  . PARoxetine (PAXIL) 20 MG tablet Take 20 mg by mouth daily.   01/29/2015 at Unknown time  . phenytoin (DILANTIN) 50 MG tablet Chew 75 mg by mouth 2  (two) times daily.   01/29/2015 at Unknown time  . Vitamin D, Ergocalciferol, (DRISDOL) 50000 UNITS CAPS capsule Take 50,000 Units by mouth every 7 (seven) days.   01/21/2015   Scheduled:  Scheduled:  . antiseptic oral rinse  7 mL Mouth Rinse BID  . desmopressin  10 mcg Nasal Daily  . feeding supplement (PRO-STAT 64)  30 mL Per Tube Q2000  . folic acid  1 mg Intravenous Daily  . free water  200 mL Per Tube 3 times per day  . hydrocortisone sod succinate (SOLU-CORTEF) inj  25 mg Intravenous Q12H  . levETIRAcetam  500 mg Intravenous Q12H  . levothyroxine  100 mcg Per Tube QAC breakfast  . phenytoin (DILANTIN) IV  75 mg Intravenous Q12H  . sodium chloride  3 mL Intravenous Q12H   Infusion[s]: Infusions:  . sodium chloride 50 mL/hr at 02/03/15 1849  . feeding supplement (OSMOLITE 1.2 CAL) 1,000 mL (02/03/15 1458)   Antibiotic[s]: Anti-infectives    Start     Dose/Rate Route Frequency Ordered Stop   01/31/15 1700  piperacillin-tazobactam (ZOSYN) IVPB 3.375 g  Status:  Discontinued     3.375 g 12.5 mL/hr over 240 Minutes Intravenous Every 8 hours 01/31/15 1557 02/01/15 1448   01/31/15 1600  vancomycin (VANCOCIN) IVPB 1000 mg/200 mL premix  Status:  Discontinued     1,000  mg 200 mL/hr over 60 Minutes Intravenous Every 8 hours 01/31/15 1556 02/01/15 0930   01/31/15 1000  Ampicillin-Sulbactam (UNASYN) 3 g in sodium chloride 0.9 % 100 mL IVPB  Status:  Discontinued     3 g 100 mL/hr over 60 Minutes Intravenous Every 6 hours 01/31/15 0954 01/31/15 1530   01/30/15 2200  vancomycin (VANCOCIN) IVPB 1000 mg/200 mL premix  Status:  Discontinued     1,000 mg 200 mL/hr over 60 Minutes Intravenous Every 8 hours 01/30/15 2013 01/31/15 0945   01/30/15 2030  piperacillin-tazobactam (ZOSYN) IVPB 3.375 g     3.375 g 100 mL/hr over 30 Minutes Intravenous  Once 01/30/15 2011 01/30/15 2217   01/30/15 1300  vancomycin (VANCOCIN) 2,000 mg in sodium chloride 0.9 % 500 mL IVPB     2,000 mg 250 mL/hr over 120  Minutes Intravenous  Once 01/30/15 1252 01/30/15 1606   01/30/15 1300  piperacillin-tazobactam (ZOSYN) IVPB 3.375 g     3.375 g 100 mL/hr over 30 Minutes Intravenous  Once 01/30/15 1252 01/30/15 1400      ASSESSMENT :  34 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   Patient to be started on Lovenox for VTE prophylaxis.  Renal function worsening since admission.  Scr 2, CrCl 42 ml/min.  Hgb and Platelets dropping without evidence of bleeding complications.  GOAL :  Lovenox dose adjusted for weight and renal function.  PLAN :  1. Lovenox 40 mg sq q 24 hours. 2. Daily CBC. Monitor for bleeding complications.   Follow Platelet counts. 3. If Platelets continue to fall, would hold Heparins and choose alternative.  Marthenia Rolling,  Pharm.D   02/03/2015,  7:13 PM

## 2015-02-03 NOTE — Progress Notes (Signed)
Nutrition Follow-up  DOCUMENTATION CODES:  Obesity unspecified  INTERVENTION:  Discontinue Vital High Protein at 45 ml/h  Initiate TF via NGT with Osmolite 1.2 @55  ml/h (1320 ml per day) to provide 1584 kcals (100% est kcal needs), 73 gm protein (100% est protein needs), 1082 ml free water daily   Provide Porstat daily for additional 15 g protein and 100 kcal  Above regimen provides 1684 kcal and 88 g protein  NUTRITION DIAGNOSIS:  Inadequate oral intake related to inability to eat as evidenced by NPO status.  ongoing  GOAL:  Patient will meet greater than or equal to 90% of their needs  unmet  MONITOR:  Diet advancement, Labs, Weight trends, I & O's  REASON FOR ASSESSMENT:  Consult Enteral/tube feeding initiation and management  ASSESSMENT: 34 yo female with hx of Mental retardation, hypothyroidism, seizures, brain neoplasm ([facility]CC) admitted on 6/13 from nursing home ([place]), with AMS and also concerning for sepsis. Intubated 6/14, extubated 6/17  Discussed pt with RN. Pt is still receiving TF via NGT, tolerating it well. Will switch from Vital HP to Osmolite 1.2 to better meet pt's nutritional needs now that pt is off the vent. Pt is still experiencing severe edema. Will continue to monitor. Labs reviewed: BUN 36, Cr 2.0, Ca 8.3  Height:  Ht Readings from Last 1 Encounters:  01/30/15 5\' 4"  (1.626 m)    Weight:  Wt Readings from Last 1 Encounters:  02/03/15 190 lb 7.6 oz (86.4 kg)    Ideal Body Weight:  54.5 kg  Wt Readings from Last 10 Encounters:  02/03/15 190 lb 7.6 oz (86.4 kg)    BMI:  Body mass index is 32.68 kg/(m^2).  Estimated Nutritional Needs:  Kcal:  1650 - 1850  Protein:  80 - 95 g  Fluid:  > 1.7 L  Skin:  Wound (see comment) (stage 1 to left heel, MASD to buttocks)  Diet Order:    NPO  EDUCATION NEEDS:  Education needs no appropriate at this time   Intake/Output Summary (Last 24 hours) at 02/03/15 1534 Last data  filed at 02/03/15 1400  Gross per 24 hour  Intake   1765 ml  Output   1675 ml  Net     90 ml    Last BM:  6/16  Galen Malkowski A. [place] Dietetic Intern Pager: [phone] 02/03/2015 3:34 PM
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
## 2015-02-03 NOTE — Progress Notes (Signed)
Attempted to call report at [phone] to [place]. Nurse not available. Will call back in 15 minutes.

## 2015-02-03 NOTE — Clinical Social Work Note (Signed)
Patient is a resident of [facility]. CSW will continue to provide continued support and to follow pt and pt's family and to facilitate pt's discharge needs once medically.   FL-2 completed.   Glendon Axe, MSW, LCSWA [phone] 02/03/2015 11:19 AM

## 2015-02-03 NOTE — Progress Notes (Signed)
Late Entry:  Transfer note:  Arrival Method: Bed from 63M Mental Orientation:A&OX1 Telemetry: N/A Skin: MSAD to groin & buttocks  IV: L F/A Pain: Denies Tubes: R Nare NG tube Safety Measures: Bed in lowest position; bed alarm activated  6700 Orientation: Patient has been oriented to the unit, staff and to the room.  Orders have been reviewed and  implemented. Will continue to monitor pt.  Gavin Potters

## 2015-02-03 NOTE — Progress Notes (Signed)
Subjective: Extubated, much improved.   Exam: Filed Vitals:   02/03/15 0726  BP:   Pulse:   Temp: 97.8 F (36.6 C)  Resp:    Gen: In bed, NAD MS: Awake, alert, moves all extremities to command, answers simple questinos.  TM:AUQJF, eyes conjugate, clinks to eyelid stim bilaterally, no blink to threat Motor: MAE to command.  Sensory:as above.   Myoclonus improved   Impression: 34 yo F with baseline MR(able to speak some and attend to ADLs) who presents with altered mental status int he setting of pan-hypopituitarism, suspected sepsis. I continue to suspect a metabolic cause, be it adrenal insufficiency, hypothyroidsism.  This would not explain her hypothermia, hypotentions, etc. Which would be better explaind by AI.     Recommendations: 1) keppra 500mg  BID 2) continue dilantin(levels will need to be followed given that depakote is an inhibitor) 3) would favor avoiding depakote, though I am not certain that this represents hyperammonemic encephalopathy due to depakote, it is a possibility. 4) will continue to follow.    Roland Rack, MD [facility] [phone]  If 7pm- 7am, please page neurology on call as listed in [place].

## 2015-02-04 LAB — BASIC METABOLIC PANEL
ANION GAP: 8 (ref 5–15)
BUN: 50 mg/dL — ABNORMAL HIGH (ref 6–20)
CHLORIDE: 114 mmol/L — AB (ref 101–111)
CO2: 25 mmol/L (ref 22–32)
CREATININE: 1.83 mg/dL — AB (ref 0.44–1.00)
Calcium: 8.9 mg/dL (ref 8.9–10.3)
GFR calc Af Amer: 41 mL/min — ABNORMAL LOW (ref >60)
GFR calc non Af Amer: 35 mL/min — ABNORMAL LOW (ref >60)
Glucose, Bld: 99 mg/dL (ref 65–99)
Potassium: 4.6 mmol/L (ref 3.5–5.1)
SODIUM: 147 mmol/L — AB (ref 135–145)

## 2015-02-04 LAB — PHENYTOIN LEVEL, TOTAL: Phenytoin Lvl: 15.9 ug/mL (ref 10.0–20.0)

## 2015-02-04 LAB — GLUCOSE, CAPILLARY
GLUCOSE-CAPILLARY: 119 mg/dL — AB (ref 65–99)
GLUCOSE-CAPILLARY: 67 mg/dL (ref 65–99)
GLUCOSE-CAPILLARY: 93 mg/dL (ref 65–99)
Glucose-Capillary: 71 mg/dL (ref 65–99)

## 2015-02-04 MED ORDER — SODIUM CHLORIDE 0.9 % IV SOLN
INTRAVENOUS | Status: DC
  Administered 2015-02-04: 16:00:00 via INTRAVENOUS

## 2015-02-04 MED ORDER — LORAZEPAM 2 MG/ML IJ SOLN
INTRAMUSCULAR | Status: AC
  Administered 2015-02-04: 1 mg
  Filled 2015-02-04: qty 1

## 2015-02-04 MED ORDER — DEXTROSE 50 % IV SOLN
INTRAVENOUS | Status: AC
  Administered 2015-02-04: 25 mL
  Filled 2015-02-04: qty 50

## 2015-02-04 MED ORDER — LORAZEPAM 2 MG/ML IJ SOLN: 1.0000 mg | INTRAMUSCULAR | Status: AC

## 2015-02-04 MED ORDER — HYDROCORTISONE 5 MG PO TABS
25.0000 mg | ORAL_TABLET | Freq: Two times a day (BID) | ORAL | Status: DC
  Administered 2015-02-04: 25 mg
  Filled 2015-02-04 (×3): qty 1

## 2015-02-04 MED ORDER — LACTULOSE 10 GM/15ML PO SOLN
30.0000 g | Freq: Two times a day (BID) | ORAL | Status: DC
Start: 2015-02-04 — End: 2015-02-05
  Administered 2015-02-04 (×2): 30 g
  Filled 2015-02-04 (×4): qty 45

## 2015-02-04 MED ORDER — PHENYTOIN 50 MG PO CHEW
75.0000 mg | CHEWABLE_TABLET | Freq: Two times a day (BID) | ORAL | Status: DC
  Administered 2015-02-04: 75 mg
  Filled 2015-02-04 (×4): qty 1.5
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
## 2015-02-04 NOTE — Progress Notes (Signed)
Subjective: Less responsive overnight.   Exam: Filed Vitals:   02/04/15 1650  BP: 130/94  Pulse: 115  Temp: 97.8 F (36.6 C)  Resp: 18   Gen: In bed, NAD MS: Awakens to noxious stimuli, states "hungry" btu doe not follow commands. Frequent re facial twitching.  SF:SELTR, does not fixate or track.  Motor: Moves all extermiteis well, briskly localizes.  Sensory:as above.   Pertinent Labs: Cr 2.0 yesterday  Impression: 34 yo F with recurrent AMS. Her initial ams I think was multifactorial and certainly her ammonia of 104(which I suspect is caused by the depakote) could certainly have been caused by this.   Having stopped depakote, her dialntin level  I am concerned she may be having intermittent partial seizures based on some facial twitching I observed when I first entered the room. If ativan were to improve her mental status then this could be suggestive of this.    Alternatively, with her creatinine going up, she may not be clearing keppra as well. I would favor continuing the current dose, but if her creatinine goes up any more, then would decrease to 250mg  BId.   Recommendations: 1) Dilantin level.  2) continue keppra 500mg  BID 3) give one time dose ativan 1mg . Will assess for clinical response.  4) Neurolgoy will continue to follow.     Roland Rack, MD [facility] [phone]  If 7pm- 7am, please page neurology on call as listed in [place].
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
## 2015-02-05 DIAGNOSIS — D6489 Other specified anemias: Secondary | ICD-10-CM

## 2015-02-05 DIAGNOSIS — K219 Gastro-esophageal reflux disease without esophagitis: Secondary | ICD-10-CM

## 2015-02-05 DIAGNOSIS — D473 Essential (hemorrhagic) thrombocythemia: Secondary | ICD-10-CM

## 2015-02-05 DIAGNOSIS — E559 Vitamin D deficiency, unspecified: Secondary | ICD-10-CM

## 2015-02-05 LAB — BASIC METABOLIC PANEL
Anion gap: 6 (ref 5–15)
BUN: 46 mg/dL — ABNORMAL HIGH (ref 6–20)
CALCIUM: 8.4 mg/dL — AB (ref 8.9–10.3)
CO2: 25 mmol/L (ref 22–32)
Chloride: 116 mmol/L — ABNORMAL HIGH (ref 101–111)
Creatinine, Ser: 1.85 mg/dL — ABNORMAL HIGH (ref 0.44–1.00)
GFR, EST AFRICAN AMERICAN: 40 mL/min — AB (ref >60)
GFR, EST NON AFRICAN AMERICAN: 35 mL/min — AB (ref >60)
Glucose, Bld: 80 mg/dL (ref 65–99)
POTASSIUM: 4.1 mmol/L (ref 3.5–5.1)
Sodium: 147 mmol/L — ABNORMAL HIGH (ref 135–145)

## 2015-02-05 LAB — CULTURE, BLOOD (ROUTINE X 2)
Culture: NO GROWTH
Culture: NO GROWTH

## 2015-02-05 LAB — CORTISOL-AM, BLOOD: Cortisol - AM: 17.4 ug/dL (ref 6.7–22.6)

## 2015-02-05 LAB — CBC
HEMATOCRIT: 25.7 % — AB (ref 36.0–46.0)
Hemoglobin: 8.6 g/dL — ABNORMAL LOW (ref 12.0–15.0)
MCH: 30.4 pg (ref 26.0–34.0)
MCHC: 33.5 g/dL (ref 30.0–36.0)
MCV: 90.8 fL (ref 78.0–100.0)
Platelets: 54 10*3/uL — ABNORMAL LOW (ref 150–400)
RBC: 2.83 MIL/uL — AB (ref 3.87–5.11)
RDW: 16.7 % — ABNORMAL HIGH (ref 11.5–15.5)
WBC: 8.3 10*3/uL (ref 4.0–10.5)

## 2015-02-05 LAB — TECHNOLOGIST SMEAR REVIEW

## 2015-02-05 LAB — AMMONIA: AMMONIA: 46 umol/L — AB (ref 9–35)

## 2015-02-05 LAB — GLUCOSE, CAPILLARY
GLUCOSE-CAPILLARY: 72 mg/dL (ref 65–99)
Glucose-Capillary: 65 mg/dL (ref 65–99)
Glucose-Capillary: 78 mg/dL (ref 65–99)
Glucose-Capillary: 98 mg/dL (ref 65–99)
Glucose-Capillary: 99 mg/dL (ref 65–99)

## 2015-02-05 MED ORDER — LEVETIRACETAM 500 MG PO TABS
500.0000 mg | ORAL_TABLET | Freq: Two times a day (BID) | ORAL | Status: DC
  Filled 2015-02-05: qty 1

## 2015-02-05 MED ORDER — PHENYTOIN 50 MG PO CHEW
75.0000 mg | CHEWABLE_TABLET | Freq: Two times a day (BID) | ORAL | Status: DC
  Administered 2015-02-05 – 2015-02-06 (×3): 75 mg via ORAL
  Filled 2015-02-05 (×4): qty 1.5

## 2015-02-05 MED ORDER — LEVOTHYROXINE SODIUM 100 MCG PO TABS
100.0000 ug | ORAL_TABLET | Freq: Every day | ORAL | Status: DC
  Administered 2015-02-05 – 2015-02-08 (×4): 100 ug via ORAL
  Filled 2015-02-05 (×5): qty 1

## 2015-02-05 MED ORDER — LEVETIRACETAM 250 MG PO TABS
250.0000 mg | ORAL_TABLET | Freq: Two times a day (BID) | ORAL | Status: DC
  Administered 2015-02-05 – 2015-02-08 (×6): 250 mg via ORAL
  Filled 2015-02-05 (×7): qty 1

## 2015-02-05 MED ORDER — CALCIUM CARB-CHOLECALCIFEROL 600-200 MG-UNIT PO TABS: 1.0000 | ORAL_TABLET | Freq: Every day | ORAL | Status: DC

## 2015-02-05 MED ORDER — DESMOPRESSIN ACETATE 0.1 MG PO TABS
0.1000 mg | ORAL_TABLET | Freq: Every day | ORAL | Status: DC
  Administered 2015-02-05 – 2015-02-08 (×4): 0.1 mg via ORAL
  Filled 2015-02-05 (×4): qty 1

## 2015-02-05 MED ORDER — PANTOPRAZOLE SODIUM 40 MG PO TBEC
40.0000 mg | DELAYED_RELEASE_TABLET | Freq: Every day | ORAL | Status: DC
  Administered 2015-02-05 – 2015-02-08 (×4): 40 mg via ORAL
  Filled 2015-02-05 (×3): qty 1

## 2015-02-05 MED ORDER — DEXTROSE 5 % IV SOLN
INTRAVENOUS | Status: AC
Start: 2015-02-05 — End: 2015-02-06
  Administered 2015-02-05: 1000 mL via INTRAVENOUS

## 2015-02-05 MED ORDER — LACTULOSE 10 GM/15ML PO SOLN
30.0000 g | Freq: Two times a day (BID) | ORAL | Status: DC
  Administered 2015-02-05 – 2015-02-06 (×3): 30 g via ORAL
  Filled 2015-02-05 (×4): qty 45

## 2015-02-05 MED ORDER — HYDROCORTISONE 5 MG PO TABS
25.0000 mg | ORAL_TABLET | Freq: Two times a day (BID) | ORAL | Status: DC
  Administered 2015-02-05 – 2015-02-08 (×7): 25 mg via ORAL
  Filled 2015-02-05 (×8): qty 1

## 2015-02-05 MED ORDER — LORAZEPAM 0.5 MG PO TABS
0.5000 mg | ORAL_TABLET | ORAL | Status: DC
  Administered 2015-02-05 – 2015-02-08 (×9): 0.5 mg via ORAL
  Filled 2015-02-05 (×9): qty 1

## 2015-02-05 MED ORDER — FOLIC ACID 1 MG PO TABS
1.0000 mg | ORAL_TABLET | Freq: Every day | ORAL | Status: DC
  Administered 2015-02-05 – 2015-02-08 (×4): 1 mg via ORAL
  Filled 2015-02-05 (×5): qty 1

## 2015-02-05 MED ORDER — CALCIUM CARBONATE-VITAMIN D 500-200 MG-UNIT PO TABS
1.0000 | ORAL_TABLET | Freq: Every day | ORAL | Status: DC
  Administered 2015-02-06 – 2015-02-08 (×3): 1 via ORAL
  Filled 2015-02-05 (×4): qty 1

## 2015-02-05 MED ORDER — PAROXETINE HCL 20 MG PO TABS
20.0000 mg | ORAL_TABLET | Freq: Every day | ORAL | Status: DC
Start: 2015-02-05 — End: 2015-02-08
  Administered 2015-02-05 – 2015-02-08 (×4): 20 mg via ORAL
  Filled 2015-02-05 (×4): qty 1

## 2015-02-05 MED ORDER — HYDROCORTISONE 5 MG PO TABS
25.0000 mg | ORAL_TABLET | Freq: Two times a day (BID) | ORAL | Status: DC
  Filled 2015-02-05: qty 1

## 2015-02-05 MED ORDER — LEVETIRACETAM ER 500 MG PO TB24: 500.0000 mg | ORAL_TABLET | Freq: Two times a day (BID) | ORAL | Status: DC

## 2015-02-05 NOTE — Progress Notes (Signed)
Subjective: Patient awake and alert sitting in chair bedside.  Has no new complaints.    Objective: Current vital signs: BP 139/85 mmHg  Pulse 88  Temp(Src) 99.1 F (37.3 C) (Oral)  Resp 16  Ht 5\' 4"  (1.626 m)  Wt 92.488 kg (203 lb 14.4 oz)  BMI 34.98 kg/m2  SpO2 97%  LMP  (LMP Unknown) Vital signs in last 24 hours: Temp:  [97.6 F (36.4 C)-99.1 F (37.3 C)] 99.1 F (37.3 C) (06/19 0947) Pulse Rate:  [84-115] 88 (06/19 0947) Resp:  [16-18] 16 (06/19 0947) BP: (117-139)/(75-94) 139/85 mmHg (06/19 0947) SpO2:  [95 %-100 %] 97 % (06/19 0947) Weight:  [92.488 kg (203 lb 14.4 oz)] 92.488 kg (203 lb 14.4 oz) (06/18 2100)  Intake/Output from previous day: 06/18 0701 - 06/19 0700 In: 3493.3 [I.V.:713.3; NG/GT:2360; IV Piggyback:420] Out: -  Intake/Output this shift: Total I/O In: 120 [P.O.:120] Out: -  Nutritional status: Diet regular Room service appropriate?: Yes; Fluid consistency:: Thin  Neurologic Exam: Mental Status: Alert.  Does not know where she is.  Speech fluent.  Able to follow simple commands.  Facial tremors noted bilaterally, right greater than left.  Has some tremors otherwise in the extremities as well that appear to be myoclonic in nature.   Cranial Nerves: II: Pupils equal, round, reactive to light and accommodation III,IV, VI: ptosis not present, extra-ocular motions intact bilaterally VIII: hearing normal bilaterally Motor: At rest right arm is in the flexed position.  When asked to move the arm it flexes further.  When asked to move the left upper extremity she holds it in a flexed position as well.  Moves both feet to command but does not lift off the reclined chair.   Sensory: Appears to respond to light stimuli throughout Deep Tendon Reflexes: 2+ in the upper extremities and absent in the lower extremities   Lab Results: Basic Metabolic Panel:  Recent Labs Lab 01/30/15 1635  02/01/15 0400 02/02/15 0500 02/03/15 0410 02/04/15 1500  02/05/15 0621  NA  --   < > 130* 135 140 147* 147*  K  --   < > 3.2* 5.5* 4.4 4.6 4.1  CL  --   < > 97* 102 110 114* 116*  CO2  --   < > 24 24 25 25 25   GLUCOSE  --   < > 110* 90 95 99 80  BUN  --   < > 13 21* 36* 50* 46*  CREATININE  --   < > 1.45* 1.73* 2.00* 1.83* 1.85*  CALCIUM  --   < > 7.9* 8.1* 8.3* 8.9 8.4*  MG 1.4*  --  2.1 2.0 2.0  --   --   PHOS 2.6  --  3.1 4.8* 5.1*  --   --   < > = values in this interval not displayed.  Liver Function Tests:  Recent Labs Lab 01/30/15 1211  AST 27  ALT 23  ALKPHOS 81  BILITOT 0.4  PROT 5.8*  ALBUMIN 3.0*   No results for input(s): LIPASE, AMYLASE in the last 168 hours.  Recent Labs Lab 02/01/15 0550 02/02/15 0500 02/05/15 0627  AMMONIA 104* 68* 46*    CBC:  Recent Labs Lab 01/30/15 1230  01/31/15 0251 02/01/15 0400 02/02/15 0500 02/03/15 0410 02/05/15 0621  WBC 3.3*  < > 5.4 6.0 9.9 6.8 8.3  NEUTROABS 1.2*  --  3.0  --   --   --   --   HGB 12.4  < >  10.7* 11.0* 10.0* 9.1* 8.6*  HCT 35.6*  < > 31.2* 31.4* 29.2* 27.3* 25.7*  MCV 89.0  < > 87.4 87.0 88.0 91.3 90.8  PLT PLATELET CLUMPS NOTED ON SMEAR, UNABLE TO ESTIMATE  < > 123* 100* 144* 91* 54*  < > = values in this interval not displayed.  Cardiac Enzymes:  Recent Labs Lab 01/31/15 1835 02/01/15 0001 02/01/15 0400  TROPONINI <0.03 <0.03 <0.03    Lipid Panel: No results for input(s): CHOL, TRIG, HDL, CHOLHDL, VLDL, LDLCALC in the last 168 hours.  CBG:  Recent Labs Lab 02/04/15 0926 02/04/15 1614 02/05/15 0005 02/05/15 0842 02/05/15 1126  GLUCAP 71 119* 47 72 37    Microbiology: Results for orders placed or performed during the hospital encounter of 01/30/15  Urine culture     Status: None   Collection Time: 01/30/15 11:19 AM  Result Value Ref Range Status   Specimen Description URINE, CATHETERIZED  Final   Special Requests NONE  Final   Colony Count   Final    90,000 COLONIES/ML Performed at [facility]    Culture   Final     Multiple bacterial morphotypes present, none predominant. Suggest appropriate recollection if clinically indicated. Performed at [facility]    Report Status 02/01/2015 FINAL  Final  Blood Culture (routine x 2)     Status: None   Collection Time: 01/30/15 12:00 PM  Result Value Ref Range Status   Specimen Description BLOOD RIGHT HAND  Final   Special Requests BOTTLES DRAWN AEROBIC ONLY 5CC  Final   Culture   Final    NO GROWTH 5 DAYS Performed at [facility]    Report Status 02/05/2015 FINAL  Final  Blood Culture (routine x 2)     Status: None   Collection Time: 01/30/15 12:30 PM  Result Value Ref Range Status   Specimen Description BLOOD RIGHT ARM  Final   Special Requests BOTTLES DRAWN AEROBIC ONLY 5CC  Final   Culture   Final    NO GROWTH 5 DAYS Performed at [facility]    Report Status 02/05/2015 FINAL  Final  MRSA PCR Screening     Status: None   Collection Time: 01/30/15  3:09 PM  Result Value Ref Range Status   MRSA by PCR NEGATIVE NEGATIVE Final    Comment:        The GeneXpert MRSA Assay (FDA approved for NASAL specimens only), is one component of a comprehensive MRSA colonization surveillance program. It is not intended to diagnose MRSA infection nor to guide or monitor treatment for MRSA infections.     Coagulation Studies: No results for input(s): LABPROT, INR in the last 72 hours.  Imaging: No results found.  Medications:  I have reviewed the patient's current medications. Prior to Admission:  Prescriptions prior to admission  Medication Sig Dispense Refill Last Dose  . albuterol (PROVENTIL HFA;VENTOLIN HFA) 108 (90 BASE) MCG/ACT inhaler Inhale 1-2 puffs into the lungs every 6 (six) hours as needed for wheezing or shortness of breath.   unknown  . Calcium Carb-Cholecalciferol (CALCIUM 600 + D) 600-200 MG-UNIT TABS Take 1 tablet by mouth daily.   01/29/2015 at Unknown time  . desmopressin (DDAVP) 0.1 MG tablet Take 0.1 mg  by mouth daily.   01/29/2015 at Unknown time  . divalproex (DEPAKOTE) 500 MG DR tablet Take 500 mg by mouth 4 (four) times daily.   01/29/2015 at Unknown time  . hydrocortisone (CORTEF) 10 MG tablet Take  10 mg by mouth 3 (three) times daily.   01/29/2015 at Unknown time  . levothyroxine (SYNTHROID) 75 MCG tablet Take 75 mcg by mouth daily before breakfast.   01/29/2015 at Unknown time  . LORazepam (ATIVAN) 1 MG tablet Take 0.5-1 mg by mouth 3 (three) times daily. 1mg  at 8am and 8pm, 0.5mg  at 1pm   01/29/2015 at Unknown time  . magnesium oxide (MAG-OX) 400 MG tablet Take 400 mg by mouth 2 (two) times daily.   01/29/2015 at Unknown time  . Omega-3 Fatty Acids (FISH OIL) 1000 MG CAPS Take 1,000 mg by mouth 2 (two) times daily.   01/29/2015 at Unknown time  . pantoprazole (PROTONIX) 40 MG tablet Take 40 mg by mouth daily.   01/29/2015 at Unknown time  . PARoxetine (PAXIL) 20 MG tablet Take 20 mg by mouth daily.   01/29/2015 at Unknown time  . phenytoin (DILANTIN) 50 MG tablet Chew 75 mg by mouth 2 (two) times daily.   01/29/2015 at Unknown time  . Vitamin D, Ergocalciferol, (DRISDOL) 50000 UNITS CAPS capsule Take 50,000 Units by mouth every 7 (seven) days.   01/21/2015    Assessment/Plan: Patient with altered mental status and myoclonus on examination.  Patient able to follow commands and answer questions despite tremors and what appears to be myoclonic activity.  I feel that seizure activity is less likely.  This is likely toxic/metabolic in etiology.  Dilantin level 15.9 on yesterday but unclear what that level would be with correction.  Ammonia level remains elevated but improved.  Patient remains on Keppra.  BUN and creatinine remain elevated.    Recommendations: 1.  Dilantin level in AM with CMET.  Agree with continuing to follow Ammonia level.    2.  Decrease Keppra to 250mg  BID   LOS: 6 days   Alexis Goodell, MD [facility] [phone] 02/05/2015  1:22 PM

## 2015-02-05 NOTE — Evaluation (Signed)
Physical Therapy Evaluation Patient Details Name: Margaret Morgan MRN: [id number] DOB: [birth year] Today's Date: 02/05/2015   History of Present Illness  34 yo female with hx of Mental retardation, hypothyroidism, seizures, brain neoplasm (RAF-HCC) comes from nursing home ([facility]), with AMS and also concerning for sepsis  Clinical Impression  Pt admitted with above diagnosis. Pt currently with functional limitations due to the deficits listed below (see PT Problem List).  Pt will benefit from skilled PT to increase their independence and safety with mobility to allow discharge to the venue listed below.       Follow Up Recommendations SNF;LTACH (postacute rehab at [facility] or SNF)  Would Margaret Morgan need a Letter of Guaranty to receive post-acute therapies?    Equipment Recommendations  None recommended by PT    Recommendations for Other Services OT consult     Precautions / Restrictions Precautions Precautions: Fall;Other (comment) Precaution Comments: Pt is hesitant to allow staff to touch her      Mobility  Bed Mobility Overal bed mobility: Needs Assistance Bed Mobility: Supine to Sit     Supine to sit: Mod assist     General bed mobility comments: Mod Assist with AAROM of R and LLEs to clear the bed; Heavy mod assist to elevate trunk to sitting  Transfers Overall transfer level: Needs assistance Equipment used: 2 person hand held assist (second person helpful for lines and to steady the chair) Transfers: Sit to/from [facility] Sit to Stand: Mod assist Stand pivot transfers: Mod assist       General transfer comment: Heavy mod assist with knees blocked to acheive standing; close guard of knees and continued mod assist with small pivot-steps to the chair; Better success when pt initiated reaching for support to initiate transfer "give me a hug", instead of simply reaching for her gait belt  Ambulation/Gait                Stairs             Wheelchair Mobility    Modified Rankin (Stroke Patients Only)       Balance Overall balance assessment: Needs assistance   Sitting balance-[name]: Fair       Standing balance-[name]: Zero                               Pertinent Vitals/Pain Pain Assessment: Faces Faces Pain Scale: Hurts little more Pain Location: bil feet with passive dorsiflexion Pain Descriptors / Indicators: Grimacing Pain Intervention(s): Monitored during session;Limited activity within patient's tolerance;Repositioned    Home Living Family/patient expects to be discharged to:: Skilled nursing facility                      Prior Function Level of Independence: Needs assistance   Gait / Transfers Assistance Needed: Margaret Morgan reports she walks with a RW           Hand Dominance        Extremity/Trunk Assessment   Upper Extremity Assessment: Defer to OT evaluation           Lower Extremity Assessment: Generalized weakness (noted bilateral foot and ankle deformity and edema)      Cervical / Trunk Assessment: Kyphotic  Communication   Communication: Other (comment) (at times slow to answer questions)  Cognition Arousal/Alertness: Awake/alert Behavior During Therapy: WFL for tasks assessed/performed;Anxious (would shrink away from a caregiver touching her) Overall Cognitive Status: No family/caregiver  present to determine baseline cognitive functioning                      General Comments      Exercises        Assessment/Plan    PT Assessment Patient needs continued PT services  PT Diagnosis Difficulty walking;Generalized weakness   PT Problem List Decreased strength;Decreased range of motion;Decreased activity tolerance;Decreased balance;Decreased mobility;Decreased coordination;Decreased cognition;Decreased knowledge of use of DME;Decreased safety awareness;Pain;Decreased knowledge of precautions  PT Treatment Interventions DME  instruction;Gait training;Functional mobility training;Therapeutic activities;Therapeutic exercise;Balance training;Neuromuscular re-education;Cognitive remediation;Patient/family education   PT Goals (Current goals can be found in the Care Plan section) Acute Rehab PT Goals Patient Stated Goal: did not state; agreeable to OOB PT Goal Formulation: With patient Time For Goal Achievement: 02/19/15 Potential to Achieve Goals: Good    Frequency Min 3X/week   Barriers to discharge   Given her insurance, will Margaret Morgan need an LOG to recienve therapies at [facility] or SNF?    Co-evaluation               End of Session Equipment Utilized During Treatment: Gait belt Activity Tolerance: Patient tolerated treatment well Patient left: in chair;with call bell/phone within reach;with chair alarm set Nurse Communication: Mobility status;Need for lift equipment         Time: 1027-1105 PT Time Calculation (min) (ACUTE ONLY): 38 min   Charges:   PT Evaluation $Initial PT Evaluation Tier I: 1 Procedure PT Treatments $Therapeutic Exercise: 23-37 mins   PT G Codes:        Margaret Morgan 02/05/2015, 1:12 PM  Margaret Morgan, [place]  Acute Rehabilitation Services Pager [phone] Office [phone]
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
## 2015-02-05 NOTE — Evaluation (Signed)
Clinical/Bedside Swallow Evaluation Patient Details  Name: Margaret Morgan MRN: [id number] Date of Birth: [birth year]  Today's Date: 02/05/2015 Time: SLP Start Time (ACUTE ONLY): 0940 SLP Stop Time (ACUTE ONLY): 0955 SLP Time Calculation (min) (ACUTE ONLY): 15 min  Past Medical History:  Past Medical History  Diagnosis Date  . Mental retardation   . Hypothyroid   . Anxiety   . Seizures   . Pituitary insufficiency   . Brain neoplasm    Past Surgical History:  Past Surgical History  Procedure Laterality Date  . 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  Pt was intubated 6/14 to 6/16 due to aspiration of vomit during NG placement.    Assessment / Plan / Recommendation Clinical Impression  Pt demonstrates normal swallow function. Able to masticate solids and swallow 8 oz of water with large consecutive straw sips without signs of aspiration. Recommend pt initiate a regular texture diet. No SLP f/u needed. Will contact MD to manage orders as pt has NG tube and may have special dietary needs.     Aspiration Risk       Diet Recommendation Age appropriate regular solids;Thin   Medication Administration: Whole meds with liquid    Other  Recommendations Oral Care Recommendations: Oral care BID   Follow Up Recommendations       Frequency and Duration        Pertinent Vitals/Pain NA    SLP Swallow Goals     Swallow Study Prior Functional Status       General Other Pertinent Information: 34 yo female with hx of intellectual disability following pituitary tumor at age 89, hypothyroidism, seizures, brain neoplasm (RAF-HCC) comes from nursing home ([facility]), with AMS and also concerning for sepsis.  Pt was intubated 6/14 to 6/16 due to aspiration of vomit during NG placement.   Type of Study: Bedside swallow evaluation Diet Prior to this Study: NPO (large NG tube) Temperature Spikes Noted: No Respiratory Status: Room air History of Recent Intubation: Yes Length of Intubations (days): 3 days Date extubated: 02/02/15 Behavior/Cognition: Alert;Cooperative;Requires cueing Oral Cavity - Dentition: Adequate natural dentition/normal for age Self-Feeding Abilities: Total assist Patient Positioning: Upright in bed Baseline Vocal Quality: Normal Volitional Cough: Strong Volitional Swallow: Unable to elicit    Oral/Motor/Sensory Function Overall Oral Motor/Sensory Function: Appears within functional limits for tasks assessed   Ice Chips     Thin Liquid Thin Liquid: Within functional limits Presentation: Straw    Nectar Thick Nectar Thick Liquid: Not tested   Honey Thick Honey Thick Liquid: Not tested   Puree Puree: Within functional limits   Solid   GO    Solid: Within functional limits      [facility], MA CCC-SLP [phone]  Lynann Beaver 02/05/2015,10:05 AM

## 2015-02-06 DIAGNOSIS — E162 Hypoglycemia, unspecified: Secondary | ICD-10-CM

## 2015-02-06 DIAGNOSIS — N179 Acute kidney failure, unspecified: Secondary | ICD-10-CM

## 2015-02-06 DIAGNOSIS — E230 Hypopituitarism: Secondary | ICD-10-CM

## 2015-02-06 DIAGNOSIS — G40909 Epilepsy, unspecified, not intractable, without status epilepticus: Secondary | ICD-10-CM

## 2015-02-06 DIAGNOSIS — T7611XD Adult physical abuse, suspected, subsequent encounter: Secondary | ICD-10-CM

## 2015-02-06 DIAGNOSIS — D696 Thrombocytopenia, unspecified: Secondary | ICD-10-CM

## 2015-02-06 DIAGNOSIS — R4182 Altered mental status, unspecified: Secondary | ICD-10-CM

## 2015-02-06 LAB — HEPATIC FUNCTION PANEL
ALT: 14 U/L (ref 14–54)
AST: 16 U/L (ref 15–41)
Albumin: 2.5 g/dL — ABNORMAL LOW (ref 3.5–5.0)
Alkaline Phosphatase: 85 U/L (ref 38–126)
TOTAL PROTEIN: 5.9 g/dL — AB (ref 6.5–8.1)
Total Bilirubin: 0.4 mg/dL (ref 0.3–1.2)

## 2015-02-06 LAB — RENAL FUNCTION PANEL
ALBUMIN: 2.5 g/dL — AB (ref 3.5–5.0)
Anion gap: 7 (ref 5–15)
BUN: 47 mg/dL — AB (ref 6–20)
CHLORIDE: 110 mmol/L (ref 101–111)
CO2: 26 mmol/L (ref 22–32)
Calcium: 8.5 mg/dL — ABNORMAL LOW (ref 8.9–10.3)
Creatinine, Ser: 1.89 mg/dL — ABNORMAL HIGH (ref 0.44–1.00)
GFR calc Af Amer: 39 mL/min — ABNORMAL LOW (ref >60)
GFR, EST NON AFRICAN AMERICAN: 34 mL/min — AB (ref >60)
Glucose, Bld: 91 mg/dL (ref 65–99)
PHOSPHORUS: 5.5 mg/dL — AB (ref 2.5–4.6)
Potassium: 3.7 mmol/L (ref 3.5–5.1)
Sodium: 143 mmol/L (ref 135–145)

## 2015-02-06 LAB — GLUCOSE, CAPILLARY
GLUCOSE-CAPILLARY: 69 mg/dL (ref 65–99)
GLUCOSE-CAPILLARY: 94 mg/dL (ref 65–99)
GLUCOSE-CAPILLARY: 81 mg/dL (ref 65–99)
Glucose-Capillary: 88 mg/dL (ref 65–99)
Glucose-Capillary: 86 mg/dL (ref 65–99)

## 2015-02-06 LAB — IRON AND TIBC
Iron: 59 ug/dL (ref 28–170)
SATURATION RATIOS: 28 % (ref 10.4–31.8)
TIBC: 211 ug/dL — AB (ref 250–450)
UIBC: 152 ug/dL

## 2015-02-06 LAB — RETICULOCYTES
RBC.: 2.9 MIL/uL — AB (ref 3.87–5.11)
Retic Count, Absolute: 55.1 10*3/uL (ref 19.0–186.0)
Retic Ct Pct: 1.9 % (ref 0.4–3.1)

## 2015-02-06 LAB — CBC
HEMATOCRIT: 26.5 % — AB (ref 36.0–46.0)
HEMOGLOBIN: 8.8 g/dL — AB (ref 12.0–15.0)
MCH: 30.3 pg (ref 26.0–34.0)
MCHC: 33.2 g/dL (ref 30.0–36.0)
MCV: 91.4 fL (ref 78.0–100.0)
Platelets: 33 10*3/uL — ABNORMAL LOW (ref 150–400)
RBC: 2.9 MIL/uL — ABNORMAL LOW (ref 3.87–5.11)
RDW: 16.7 % — ABNORMAL HIGH (ref 11.5–15.5)
WBC: 7.6 10*3/uL (ref 4.0–10.5)

## 2015-02-06 LAB — APTT: APTT: 38 s — AB (ref 24–37)

## 2015-02-06 LAB — CORTISOL-AM, BLOOD: CORTISOL - AM: 8 ug/dL (ref 6.7–22.6)

## 2015-02-06 LAB — AMMONIA: Ammonia: 25 umol/L (ref 9–35)

## 2015-02-06 LAB — OCCULT BLOOD X 1 CARD TO LAB, STOOL: Fecal Occult Bld: NEGATIVE

## 2015-02-06 LAB — FOLATE: FOLATE: 11.2 ng/mL (ref >5.9)

## 2015-02-06 LAB — PHENYTOIN LEVEL, TOTAL: Phenytoin Lvl: 16.9 ug/mL (ref 10.0–20.0)

## 2015-02-06 LAB — FERRITIN: FERRITIN: 368 ng/mL — AB (ref 11–307)

## 2015-02-06 LAB — PROTIME-INR
INR: 0.98 (ref 0.00–1.49)
PROTHROMBIN TIME: 13.2 s (ref 11.6–15.2)

## 2015-02-06 LAB — MAGNESIUM: MAGNESIUM: 1.9 mg/dL (ref 1.7–2.4)

## 2015-02-06 MED ORDER — PHENYTOIN SODIUM EXTENDED 100 MG PO CAPS
100.0000 mg | ORAL_CAPSULE | Freq: Every day | ORAL | Status: DC
  Administered 2015-02-07 – 2015-02-08 (×2): 100 mg via ORAL
  Filled 2015-02-06 (×2): qty 1

## 2015-02-06 MED ORDER — PRO-STAT SUGAR FREE PO LIQD
30.0000 mL | Freq: Two times a day (BID) | ORAL | Status: DC
  Administered 2015-02-06 – 2015-02-08 (×5): 30 mL via ORAL
  Filled 2015-02-06 (×4): qty 30

## 2015-02-06 MED ORDER — LACTULOSE 10 GM/15ML PO SOLN
20.0000 g | Freq: Two times a day (BID) | ORAL | Status: DC
Start: 2015-02-06 — End: 2015-02-06
  Filled 2015-02-06: qty 30

## 2015-02-06 MED ORDER — PHENYTOIN 50 MG PO CHEW: 100.0000 mg | CHEWABLE_TABLET | Freq: Every day | ORAL | Status: DC

## 2015-02-06 MED ORDER — LACTULOSE 10 GM/15ML PO SOLN
10.0000 g | Freq: Two times a day (BID) | ORAL | Status: DC
  Administered 2015-02-06 – 2015-02-08 (×4): 10 g via ORAL
  Filled 2015-02-06 (×5): qty 15

## 2015-02-06 NOTE — Progress Notes (Signed)
Subjective: Refuses to follow commands or take part in exam.  " leave me alone I do not want to do anything with you".   Objective: Current vital signs: BP 139/99 mmHg  Pulse 64  Temp(Src) 97.6 F (36.4 C) (Oral)  Resp 17  Ht 5\' 4"  (1.626 m)  Wt 94.394 kg (208 lb 1.6 oz)  BMI 35.70 kg/m2  SpO2 100%  LMP  (LMP Unknown) Vital signs in last 24 hours: Temp:  [97.5 F (36.4 C)-98.4 F (36.9 C)] 97.6 F (36.4 C) (06/20 0808) Pulse Rate:  [64-90] 64 (06/20 0808) Resp:  [16-20] 17 (06/20 0808) BP: (125-139)/(83-99) 139/99 mmHg (06/20 0808) SpO2:  [93 %-100 %] 100 % (06/20 0808) Weight:  [94.394 kg (208 lb 1.6 oz)] 94.394 kg (208 lb 1.6 oz) (06/19 2049)  Intake/Output from previous day: 06/19 0701 - 06/20 0700 In: 355 [P.O.:240; I.V.:115] Out: 1 [Stool:1] Intake/Output this shift: Total I/O In: 600 [P.O.:600] Out: -  Nutritional status: Diet regular Room service appropriate?: Yes; Fluid consistency:: Thin  Neurologic Exam: Mental Status: Alert, refusing to talk and take part in exam.  Speech fluent without evidence of aphasia.   Cranial Nerves: II:  Visual fields grossly normal, pupils equal, round, reactive to light and accommodation III,IV, VI: extra-ocular motions intact bilaterally V,VII: face symmetric,  VIII: hearing normal bilaterally XII: midline tongue   Motor: When sleeping shows polymyoclonus but one awakened showed no tremor or myoclonus. Refusing to move extremities and take part in exam.  Appears to be moving extremities well spontaneously.  Sensory: Pinprick and light touch intact throughout, bilaterally Deep Tendon Reflexes:  Would not allow me to test  Plantars: Would not allow me to test    Lab Results: Basic Metabolic Panel:  Recent Labs Lab 01/30/15 1635  02/01/15 0400 02/02/15 0500 02/03/15 0410 02/04/15 1500 02/05/15 0621 02/06/15 0621  NA  --   < > 130* 135 140 147* 147* 143  K  --   < > 3.2* 5.5* 4.4 4.6 4.1 3.7  CL  --   < > 97*  102 110 114* 116* 110  CO2  --   < > 24 24 25 25 25 26   GLUCOSE  --   < > 110* 90 95 99 80 91  BUN  --   < > 13 21* 36* 50* 46* 47*  CREATININE  --   < > 1.45* 1.73* 2.00* 1.83* 1.85* 1.89*  CALCIUM  --   < > 7.9* 8.1* 8.3* 8.9 8.4* 8.5*  MG 1.4*  --  2.1 2.0 2.0  --   --  1.9  PHOS 2.6  --  3.1 4.8* 5.1*  --   --  5.5*  < > = values in this interval not displayed.  Liver Function Tests:  Recent Labs Lab 01/30/15 1211 02/06/15 0621  AST 27 16  ALT 23 14  ALKPHOS 81 85  BILITOT 0.4 0.4  PROT 5.8* 5.9*  ALBUMIN 3.0* 2.5*  2.5*   No results for input(s): LIPASE, AMYLASE in the last 168 hours.  Recent Labs Lab 02/02/15 0500 02/05/15 0627 02/06/15 0621  AMMONIA 68* 46* 25    CBC:  Recent Labs Lab 01/30/15 1230  01/31/15 0251 02/01/15 0400 02/02/15 0500 02/03/15 0410 02/05/15 0621 02/06/15 0621  WBC 3.3*  < > 5.4 6.0 9.9 6.8 8.3 7.6  NEUTROABS 1.2*  --  3.0  --   --   --   --   --   HGB 12.4  < >  10.7* 11.0* 10.0* 9.1* 8.6* 8.8*  HCT 35.6*  < > 31.2* 31.4* 29.2* 27.3* 25.7* 26.5*  MCV 89.0  < > 87.4 87.0 88.0 91.3 90.8 91.4  PLT PLATELET CLUMPS NOTED ON SMEAR, UNABLE TO ESTIMATE  < > 123* 100* 144* 91* 54* 33*  < > = values in this interval not displayed.  Cardiac Enzymes:  Recent Labs Lab 01/31/15 1835 02/01/15 0001 02/01/15 0400  TROPONINI <0.03 <0.03 <0.03    Lipid Panel: No results for input(s): CHOL, TRIG, HDL, CHOLHDL, VLDL, LDLCALC in the last 168 hours.  CBG:  Recent Labs Lab 02/05/15 1126 02/05/15 1639 02/05/15 2340 02/06/15 0748 02/06/15 0831  GLUCAP 65 99 78 23 94    Microbiology: Results for orders placed or performed during the hospital encounter of 01/30/15  Urine culture     Status: None   Collection Time: 01/30/15 11:19 AM  Result Value Ref Range Status   Specimen Description URINE, CATHETERIZED  Final   Special Requests NONE  Final   Colony Count   Final    90,000 COLONIES/ML Performed at [facility]     Culture   Final    Multiple bacterial morphotypes present, none predominant. Suggest appropriate recollection if clinically indicated. Performed at [facility]    Report Status 02/01/2015 FINAL  Final  Blood Culture (routine x 2)     Status: None   Collection Time: 01/30/15 12:00 PM  Result Value Ref Range Status   Specimen Description BLOOD RIGHT HAND  Final   Special Requests BOTTLES DRAWN AEROBIC ONLY 5CC  Final   Culture   Final    NO GROWTH 5 DAYS Performed at [facility]    Report Status 02/05/2015 FINAL  Final  Blood Culture (routine x 2)     Status: None   Collection Time: 01/30/15 12:30 PM  Result Value Ref Range Status   Specimen Description BLOOD RIGHT ARM  Final   Special Requests BOTTLES DRAWN AEROBIC ONLY 5CC  Final   Culture   Final    NO GROWTH 5 DAYS Performed at [facility]    Report Status 02/05/2015 FINAL  Final  MRSA PCR Screening     Status: None   Collection Time: 01/30/15  3:09 PM  Result Value Ref Range Status   MRSA by PCR NEGATIVE NEGATIVE Final    Comment:        The GeneXpert MRSA Assay (FDA approved for NASAL specimens only), is one component of a comprehensive MRSA colonization surveillance program. It is not intended to diagnose MRSA infection nor to guide or monitor treatment for MRSA infections.     Coagulation Studies:  Recent Labs  02/06/15 0621  LABPROT 13.2  INR 0.98    Imaging: No results found.  Medications:  Scheduled: . antiseptic oral rinse  7 mL Mouth Rinse BID  . calcium-vitamin D  1 tablet Oral Q breakfast  . desmopressin  0.1 mg Oral Daily  . folic acid  1 mg Oral Daily  . hydrocortisone  25 mg Oral BID  . lactulose  30 g Oral BID  . levETIRAcetam  250 mg Oral BID  . levothyroxine  100 mcg Oral QAC breakfast  . LORazepam  0.5 mg Oral 3 times per day  . pantoprazole  40 mg Oral Daily  . PARoxetine  20 mg Oral Daily  . phenytoin  75 mg Oral BID  . sodium chloride  3 mL  Intravenous Q12H    Assessment/Plan:  Patein alert and awake, refusing to talk or take part in exam. At rest showed polymyoclonus but this stopped once she was aroused. Dilantin level 16.9 corrected is 28.2, ammonia 25. Although a variety of movement disorders have been described in dilantin toxicity, I am not completely sure patient myolymyoclonus exclusively secondary to elevated dilantin level. However, would have pharmacy dose dilantin.   Neurology will S/O  Etta Quill PA-C [facility] [phone]  02/06/2015, 11:21 AM
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
## 2015-02-06 NOTE — Progress Notes (Signed)
Nutrition Follow-up  DOCUMENTATION CODES:  Obesity unspecified  INTERVENTION:  Prostat po BID, each supplement provides 100 kcal and 15 grams of protein.  Encourage adequate PO intake.  NUTRITION DIAGNOSIS:  Inadequate oral intake related to inability to eat as evidenced by NPO status; diet advanced- po 25-100%; ongoing  GOAL:  Patient will meet greater than or equal to 90% of their needs; progressing  MONITOR:  PO intake, Supplement acceptance, Weight trends, Labs, I & O's  REASON FOR ASSESSMENT:  Consult Enteral/tube feeding initiation and management  ASSESSMENT: 34 yo female with hx of Mental retardation, hypothyroidism, seizures, brain neoplasm ([facility]) admitted on 6/13 from nursing home ([place]), with AMS and also concerning for sepsis. Intubated 6/14, extubated 6/17  Pt advanced to a regular diet 6/19. TF has been discontinued. Meal completion has been varied from 25-100%. Pt reports having a good appetite. Pt is agreeable to supplements to aid in caloric and protein needs. RD to order. Pt was encouraged to eat her food at meals and to consume her supplements. Of note, pt +14 L since admission.   Labs: Low calcium and GFR. High BUN, creatinine, and phosphorous (5.5).  Height:  Ht Readings from Last 1 Encounters:  01/30/15 5\' 4"  (1.626 m)    Weight:  Wt Readings from Last 1 Encounters:  02/05/15 208 lb 1.6 oz (94.394 kg)    Ideal Body Weight:  54.5 kg  Wt Readings from Last 10 Encounters:  02/05/15 208 lb 1.6 oz (94.394 kg)  Admit wt.[phone] lbs  BMI:  Body mass index is 35.7 kg/(m^2).  Estimated Nutritional Needs:  Kcal:  1650 - 4492  Protein:  80 - 95 g  Fluid:  > 1.7 L  Skin:  Wound (see comment) (Stage I on L heel, +3 UE, +2 LE edema)  Diet Order:  Diet regular Room service appropriate?: Yes; Fluid consistency:: Thin  EDUCATION NEEDS:  Education needs no appropriate at this time   Intake/Output Summary (Last 24 hours) at  02/06/15 1535 Last data filed at 02/06/15 1256  Gross per 24 hour  Intake   1075 ml  Output      1 ml  Net   1074 ml    Last BM:  6/20  Corrin Parker, MS, RD, LDN Pager # [phone] After hours/ weekend pager # [phone]
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
## 2015-02-06 NOTE — Progress Notes (Signed)
   Hypoglycemic Event  CBG: 69  Treatment: 15 GM carbohydrate snack  Symptoms: None  Follow-up CBG: Time: 0833 CBG Result:94  Possible Reasons for Event: Inadequate meal intake  Comments/MD notified: Resident made aware during rounds    [facility] M

## 2015-02-06 NOTE — Progress Notes (Addendum)
MEDICATION RELATED CONSULT NOTE - FOLLOW UP   Pharmacy Consult for phenytoin  Indication: hx of seizure.  Assessment: 89 YOF on dilantin and depakote PTA, admitted with AMS and myoclonus, neurology changed depakote to keppra d/t elevated ammonia level. Now improved  phenytoin - Ammonia 104 >> 25. Pharmacy is helping with phenytoin dosing. Phenytoin level 15.9 on 6/18, 16.9 (corr 28) on 6/20, Free phenytoin 2.7 on 6/13 (PTA depakote) - EEG negative for seizure activity, Keppra decreased to 250 mg BID. Per neuro note- myoclonus >>seizure activity is less likely. Pt's seizure has been controlled.  Noted depakote stopped 6/15 (VPA + phenytoin can increase phenytoin levels)   Goal of Therapy:  Corrected phenytoin level = 10-20  Plan:  Decrease phenytoin dose to 100 mg daily using ER capsule  Recommend recheck phenytoin level in 5 days and increase to 125 mg total/day if corrected level is < 10  Maryanna Shape, PharmD, BCPS  Clinical Pharmacist  Pager: [phone]   02/06/2015,2:51 PM

## 2015-02-06 NOTE — Clinical Social Work Placement (Addendum)
   CLINICAL SOCIAL WORK PLACEMENT  NOTE  Date:  02/06/2015  Patient Details  Name: Margaret Morgan MRN: [id number] Date of Birth: 12-16-1980  Clinical Social Work is seeking post-discharge placement for this patient at the [place] level of care (*CSW will initial, date and re-position this form in  chart as items are completed):  Yes   Patient/family provided with [place] Work Department's list of facilities offering this level of care within the geographic area requested by the patient (or if unable, by the patient's family).  Yes   Patient/family informed of their freedom to choose among providers that offer the needed level of care, that participate in Medicare, Medicaid or managed care program needed by the patient, have an available bed and are willing to accept the patient.  Yes   Patient/family informed of [facility]'s ownership interest in [facility] and [facility], as well as of the fact that they are under no obligation to receive care at these facilities.  PASRR submitted to EDS on 02/06/15     PASRR number received on       Existing PASRR number confirmed on       FL2 transmitted to all facilities in geographic area requested by pt/family on 02/06/15     FL2 transmitted to all facilities within larger geographic area on 02/06/15     Patient informed that his/her managed care company has contracts with or will negotiate with certain facilities, including the following:            Patient/family informed of bed offers received.  Patient chooses bed at       Physician recommends and patient chooses bed at      Patient to be transferred to   on  .  Patient to be transferred to facility by       Patient family notified on   of transfer.  Name of family member notified:        PHYSICIAN       Additional Comment:  30 day notes submitted for [facility].  Awaiting manual review   _______________________________________________ Lilly Cove, LCSW 02/06/2015, 3:12 PM

## 2015-02-07 ENCOUNTER — Inpatient Hospital Stay (HOSPITAL_COMMUNITY): Payer: Medicare Other

## 2015-02-07 DIAGNOSIS — M25561 Pain in right knee: Secondary | ICD-10-CM

## 2015-02-07 LAB — CBC
HCT: 27.4 % — ABNORMAL LOW (ref 36.0–46.0)
Hemoglobin: 9.1 g/dL — ABNORMAL LOW (ref 12.0–15.0)
MCH: 30.3 pg (ref 26.0–34.0)
MCHC: 33.2 g/dL (ref 30.0–36.0)
MCV: 91.3 fL (ref 78.0–100.0)
PLATELETS: 56 10*3/uL — AB (ref 150–400)
RBC: 3 MIL/uL — ABNORMAL LOW (ref 3.87–5.11)
RDW: 16.4 % — AB (ref 11.5–15.5)
WBC: 7.6 10*3/uL (ref 4.0–10.5)

## 2015-02-07 LAB — COMPREHENSIVE METABOLIC PANEL
ALBUMIN: 2.5 g/dL — AB (ref 3.5–5.0)
ALT: 24 U/L (ref 14–54)
AST: 39 U/L (ref 15–41)
Alkaline Phosphatase: 101 U/L (ref 38–126)
Anion gap: 7 (ref 5–15)
BUN: 47 mg/dL — AB (ref 6–20)
CALCIUM: 8.7 mg/dL — AB (ref 8.9–10.3)
CO2: 27 mmol/L (ref 22–32)
CREATININE: 1.96 mg/dL — AB (ref 0.44–1.00)
Chloride: 110 mmol/L (ref 101–111)
GFR, EST AFRICAN AMERICAN: 37 mL/min — AB (ref >60)
GFR, EST NON AFRICAN AMERICAN: 32 mL/min — AB (ref >60)
GLUCOSE: 122 mg/dL — AB (ref 65–99)
POTASSIUM: 3.5 mmol/L (ref 3.5–5.1)
Sodium: 144 mmol/L (ref 135–145)
TOTAL PROTEIN: 5.8 g/dL — AB (ref 6.5–8.1)
Total Bilirubin: 0.3 mg/dL (ref 0.3–1.2)

## 2015-02-07 LAB — GLUCOSE, CAPILLARY
GLUCOSE-CAPILLARY: 119 mg/dL — AB (ref 65–99)
GLUCOSE-CAPILLARY: 90 mg/dL (ref 65–99)
GLUCOSE-CAPILLARY: 108 mg/dL — AB (ref 65–99)
Glucose-Capillary: 59 mg/dL — ABNORMAL LOW (ref 65–99)
Glucose-Capillary: 76 mg/dL (ref 65–99)

## 2015-02-07 LAB — VITAMIN D 25 HYDROXY (VIT D DEFICIENCY, FRACTURES): Vit D, 25-Hydroxy: 55.5 ng/mL (ref 30.0–100.0)

## 2015-02-07 MED ORDER — PRO-STAT SUGAR FREE PO LIQD: 30.0000 mL | Freq: Two times a day (BID) | ORAL | Status: DC

## 2015-02-07 MED ORDER — PHENYTOIN SODIUM EXTENDED 100 MG PO CAPS: 100.0000 mg | ORAL_CAPSULE | Freq: Every day | ORAL | Status: AC

## 2015-02-07 MED ORDER — HYDROCORTISONE 10 MG PO TABS: 25.0000 mg | ORAL_TABLET | Freq: Two times a day (BID) | ORAL | Status: AC

## 2015-02-07 MED ORDER — LACTULOSE 10 GM/15ML PO SOLN: 10.0000 g | Freq: Two times a day (BID) | ORAL | Status: AC

## 2015-02-07 MED ORDER — LEVOTHYROXINE SODIUM 100 MCG PO TABS: 100.0000 ug | ORAL_TABLET | Freq: Every day | ORAL | Status: AC

## 2015-02-07 MED ORDER — LEVETIRACETAM 250 MG PO TABS: 250.0000 mg | ORAL_TABLET | Freq: Two times a day (BID) | ORAL | Status: AC

## 2015-02-07 MED ORDER — ACETAMINOPHEN 325 MG PO TABS
650.0000 mg | ORAL_TABLET | Freq: Four times a day (QID) | ORAL | Status: DC | PRN
  Administered 2015-02-07: 650 mg via ORAL
  Filled 2015-02-07: qty 2

## 2015-02-07 NOTE — Progress Notes (Addendum)
Patient has a current SNF [facility]: 30 days. Patient has been faxed out to SNFs in [place] and [place]. Has 1 bed offer from [place] [facility] and a pending bed offer with [facility]. (Tammy from [facility] has inquired more about patient's insurance and status).  Unclear if patient is ready for discharge at this time, per progression and CM conversation. Patient's brother is seeking ST SNF and ALF made aware patient will go to [facility].  Brother contacted: [name]: but message was left and will update him once he calls back. Brother called: Accepted bed at [place].  SNF contacted made aware of bed choice, currently bed is pending insurance review and clinicals by nursing. Tammy from [facility] will be calling with denial or acceptance today.   Will continue to follow and assist with disposition.  Lane Hacker, MSW Clinical Social Work: Emergency Room [phone]

## 2015-02-07 NOTE — Progress Notes (Addendum)
   02/07/15 1100  What Happened  Was fall witnessed? Yes  Who witnessed fall? Rondel Oh, Lorriane Shire PT tech;  Patients activity before fall ambulating-assisted  Point of contact other (comment) (Bilateral Knees)  Was patient injured? Yes  Follow Up  MD notified Dr. Genene Churn  Time MD notified 1152  Family notified Yes-comment  Time family notified 37 (Pt's brother Margaret Morgan notified )  Additional tests Yes-comment  Simple treatment Ice  Adult Fall Risk Assessment  Risk Factor Category (scoring not indicated) High fall risk per protocol (document High fall risk)  Patient's Fall Risk High Fall Risk (>13 points)  Adult Fall Risk Interventions  Required Bundle Interventions *See Row Information* High fall risk - low, moderate, and high requirements implemented  Additional Interventions Bed alarm not indicated with the bundle  Fall with Injury Screening  Risk For Fall Injury- See Row Information  F;Nurse judgement  Intervention(s) for 2 or more risk criteria identified Low Bed  Pain Assessment  Pain Assessment 0-10  Pain Score 10  Pain Type Acute pain  Pain Location Knee  Pain Orientation Right  Pain Descriptors / Indicators Aching  Patients Stated Pain Goal 0  Pain Intervention(s) Cold applied;Other (Comment);Medication (See eMAR) (MD paged for PRN pain medication)  PCA/Epidural/Spinal Assessment  Respiratory Pattern Regular  Neurological  Neuro (WDL) X  Level of Consciousness Alert  Orientation Level Oriented to person;Oriented to place;Disoriented to time;Disoriented to situation  Cognition Developmentally delayed;Follows commands;Impulsive  Speech Delayed responses  Pupil Assessment  Yes  R Pupil Size (mm) 3  R Pupil Shape Round  R Pupil Reaction Brisk  L Pupil Size (mm) 3  L Pupil Shape Round  L Pupil Reaction Brisk  Facial Symmetry Symmetrical  R Hand Grip Strong  L Hand Grip Strong  R Foot Dorsiflexion Weak  L Foot Dorsiflexion Present  R Foot Plantar Flexion  Present  L Foot Plantar Flexion Present  Musculoskeletal  Musculoskeletal (WDL) X  Assistive Device MaxiMove  Generalized Weakness Yes  Integumentary  Integumentary (WDL) X  Skin Color Appropriate for ethnicity;Pale  Skin Condition Dry  Skin Integrity MSAD;Ecchymosis  Moisture Associated Skin Damage Location Buttocks  Moisture Associated Skin Damage Orientation Right;Left  Moisture Associated Skin Damage Intervention Barrier cream;Cleansed  Ecchymosis Location Abdomen;Arm;Thigh  Ecchymosis Location Orientation Right;Left;Circumferential  Intertriginous Dermatitis Location Groin  Intertriginous Dermatitis Location Orientation Right;Left  Intertriginous Dermatitis Intervention Other (Comment) (antifungal powder)  Skin Turgor Non-tenting  Pain Assessment  Date Pain First Started 02/07/15  Pain Screening  Clinical Progression Not changed  Effect of Pain on Daily Activities none  Response to Interventions Pt states pain is decreased since ice pack applied    02/07/15 1201  Vitals  Temp 97.7 F (36.5 C)  Temp Source Axillary  BP 124/78 mmHg  BP Location Right Arm  BP Method Automatic  Patient Position (if appropriate) Lying  Pulse Rate 82  Pulse Rate Source Dinamap  Resp 17
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
## 2015-02-07 NOTE — Progress Notes (Signed)
Physical Therapy Treatment Patient Details Name: Margaret Morgan MRN: [id number] DOB: Jul 19, 1981 Today's Date: 02/07/2015    History of Present Illness 34 yo female with hx of Mental retardation, hypothyroidism, seizures, brain neoplasm ([facility]) comes from nursing home ([facility]), with AMS and also concerning for sepsis    PT Comments    Session began focusing on maximizing pt participation and increasing her independence with functional mobility; Aryannah initiated getting up to EOB well, pulled herself to unsupported sitting, needing light mod assist to scoot ips to EOB (an improvement over eval, when she required assist to elevate trunk to sitting); Chenell initiated sit to stand as well, somewhat impulsively, with mod assist of 2;  With noted progress with bed mobility and transfers, we decided to try walking with RW and 2 person assist;   Intitiated amb with RW and +2 assist for safety; Anijah took a few short steps, then began to squat (likely because she began stooling; she was unable to notify us of this); Given deconditioning, she was unable to maintain squat, and began to descend to the floor; with heavy assist of 2, we assisted in slowly lowering her to the floor, on her knees, then we were able to assist her to long sit on the floor; Carlyn Reichert, RN, [place], [place], and Maggie, Nurse tech arrived and assisted [place] with using the [facility] to lift pt back to bed;   [name] reports R knee pain, tender to palpation inferior medial aspect of knee; Grimace with AAROM attempts into flexion or extension (worth noting she grimaced with ROM assessment on PT eval 2 days ago as well, though her grimace today was more pronounced with ROM of R knee, coupled with reports of pain); See Doc Flowsheets for vitals;   Notified Dr. Genene Churn of above  Continue to recommend postacute rehab at SNF   Follow Up Recommendations  SNF;LTACH (postacute rehab at [facility] or SNF)     Equipment Recommendations  None recommended  by PT    Recommendations for Other Services OT consult     Precautions / Restrictions Precautions Precautions: Fall;Other (comment) (Guard both knees closely) Precaution Comments: Pt is hesitant to allow staff to touch her    Mobility  Bed Mobility Overal bed mobility: Needs Assistance Bed Mobility: Supine to Sit     Supine to sit: Mod assist     General bed mobility comments: Light mod assist and use of bed pad to square off hips at EOB; Pt used rails to pull to unsupported sitting without physcial assist; Good initiation of scoot to EOB  Transfers Overall transfer level: Needs assistance Equipment used: 2 person hand held assist;Rolling walker (2 wheeled) Transfers: Sit to/from Stand Sit to Stand: Mod assist         General transfer comment: Heavy mod assist to get to fully standing; pt's hands on RW; Pt initiated standing impulsively  Ambulation/Gait Ambulation/Gait assistance: +2 safety/equipment;Mod assist;Max assist Ambulation Distance (Feet): 2 Feet Assistive device: Rolling walker (2 wheeled) Gait Pattern/deviations: Shuffle     General Gait Details: Intitiated amb with RW and +2 assist for safety; Coriann took a few short steps, then began to squat (likely because she began stooling); Given deconditioning, she was unable to maintain squat, and began to descend to the floor; with heavy assist of 2, we assisted in slowly lowering her to the floor, on her knees, then we were able to assist her to long sit on the floor   Stairs  Wheelchair Mobility    Modified Rankin (Stroke Patients Only)       Balance     Sitting balance-[name]: Fair       Standing balance-[name]: Zero (approaching Poor)                      Cognition Arousal/Alertness: Awake/alert Behavior During Therapy: WFL for tasks assessed/performed;Anxious Overall Cognitive Status: No family/caregiver present to determine baseline cognitive functioning                       Exercises      General Comments        Pertinent Vitals/Pain Pain Assessment: Faces Faces Pain Scale: Hurts even more Pain Location: R knee inferior aspect medially after she descended to her knees of the floor Pain Descriptors / Indicators: Grimacing (tenederness to palpation) Pain Intervention(s): Limited activity within patient's tolerance;Monitored during session;Repositioned;Other (comment) (Notified Dr. Genene Churn)    Home Living                      Prior Function            PT Goals (current goals can now be found in the care plan section) Acute Rehab PT Goals Patient Stated Goal: agreeable to OOB PT Goal Formulation: With patient Time For Goal Achievement: 02/19/15 Potential to Achieve Goals: Good Progress towards PT goals: Progressing toward goals (Noted good progress with bed mobility; unable to ambulate today)    Frequency  Min 3X/week    PT Plan Current plan remains appropriate    Co-evaluation             End of Session Equipment Utilized During Treatment: Gait belt Activity Tolerance: Patient limited by fatigue Patient left: in bed;with nursing/sitter in room (RN assisting with hygeine, vitals)     Time: 4665-9935 PT Time Calculation (min) (ACUTE ONLY): 25 min  Charges:  $Therapeutic Activity: 23-37 mins                    G Codes:      Quin Hoop 02/07/2015, 12:46 PM  Roney Marion, [place] Pager [phone] Office [phone]
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
## 2015-02-07 NOTE — Progress Notes (Signed)
Hypoglycemic Event  CBG: 59  Treatment: 15 GM carbohydrate snack  Symptoms: Hungry  Follow-up CBG: Time: 0823  CBG Result:119  Possible Reasons for Event: Inadequate meal intake  Comments/MD notified:MD student made aware during morning rounds    [facility] M

## 2015-02-07 NOTE — Progress Notes (Signed)
OT Cancellation Note  Patient Details Name: Margaret Morgan MRN: [id number] DOB: 1981-06-26   Cancelled Treatment:    Reason Eval/Treat Not Completed: Other (comment);Fatigue/lethargy limiting ability to participate. Pt refusing to sit up with OT.  Benito Mccreedy OTR/L [phone] 02/07/2015, 10:42 AM
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
## 2015-02-08 DIAGNOSIS — F418 Other specified anxiety disorders: Secondary | ICD-10-CM

## 2015-02-08 DIAGNOSIS — E272 Addisonian crisis: Secondary | ICD-10-CM

## 2015-02-08 LAB — GLUCOSE, CAPILLARY
GLUCOSE-CAPILLARY: 107 mg/dL — AB (ref 65–99)
GLUCOSE-CAPILLARY: 78 mg/dL (ref 65–99)
Glucose-Capillary: 66 mg/dL (ref 65–99)
Glucose-Capillary: 76 mg/dL (ref 65–99)
Glucose-Capillary: 94 mg/dL (ref 65–99)

## 2015-02-08 LAB — CBC
HCT: 23.7 % — ABNORMAL LOW (ref 36.0–46.0)
Hemoglobin: 8 g/dL — ABNORMAL LOW (ref 12.0–15.0)
MCH: 31 pg (ref 26.0–34.0)
MCHC: 33.8 g/dL (ref 30.0–36.0)
MCV: 91.9 fL (ref 78.0–100.0)
Platelets: 106 10*3/uL — ABNORMAL LOW (ref 150–400)
RBC: 2.58 MIL/uL — ABNORMAL LOW (ref 3.87–5.11)
RDW: 16.1 % — AB (ref 11.5–15.5)
WBC: 8.1 10*3/uL (ref 4.0–10.5)

## 2015-02-08 LAB — HEPATITIS C ANTIBODY: HCV Ab: <0.1 s/co ratio (ref 0.0–0.9)

## 2015-02-08 MED ORDER — ENSURE ENLIVE PO LIQD
237.0000 mL | Freq: Two times a day (BID) | ORAL | Status: DC
  Administered 2015-02-08: 237 mL via ORAL

## 2015-02-08 NOTE — Progress Notes (Signed)
Patient Discharge:  Disposition: Pt discharged to [facility]  Education: Discharge summary sent to [facility] and report given to nurse at [facility]  IV: Removed  Transportation: Via Ambulance   Belongings: All belongings taken with pt.

## 2015-02-08 NOTE — Progress Notes (Addendum)
Nutrition Follow-up  DOCUMENTATION CODES:  Obesity unspecified  INTERVENTION: Discontinue Prostat.  Ensure Enlive po BID, each supplement provides 350 kcal and 20 grams of protein  Encourage adequate PO intake.   NUTRITION DIAGNOSIS:  Inadequate oral intake related to inability to eat as evidenced by NPO status; advanced to diet- po 0-25%; ongoing  GOAL:  Patient will meet greater than or equal to 90% of their needs; not met  MONITOR:  PO intake, Supplement acceptance, Weight trends, Labs, I & O's  REASON FOR ASSESSMENT:  Consult Enteral/tube feeding initiation and management  ASSESSMENT: 34 yo female with hx of Mental retardation, hypothyroidism, seizures, brain neoplasm (RAF-HCC) admitted on 6/13 from nursing home ([facility]), with AMS and also concerning for sepsis. Intubated 6/14, extubated 6/17  Meal completion has been 0-25%. Pt has been consuming her Prostat, however reports she does not like the taste and would like to try alternative supplements. RD to order Ensure. RD was additionally paged regarding pt's hypoglycemia which could be associated with inadequate oral intake and hypothyroidism. RD to put in instructions on how to prevent hypoglycemia through diet in discharge instructions. Pt was encouraged to eat her food at meals and to drink her supplements.   Labs and medications reviewed.   Height:  Ht Readings from Last 1 Encounters:  01/30/15 $RemoveB'5\' 4"'vBxJTOBg$  (1.626 m)    Weight:  Wt Readings from Last 1 Encounters:  02/07/15 205 lb 11.2 oz (93.305 kg)    Ideal Body Weight:  54.5 kg  Wt Readings from Last 10 Encounters:  02/07/15 205 lb 11.2 oz (93.305 kg)    BMI:  Body mass index is 35.29 kg/(m^2).  Estimated Nutritional Needs:  Kcal:  1650 - 1007  Protein:  80 - 95 g  Fluid:  > 1.7 L  Skin:  Wound (see comment) (Stage I on L heel, +3 UE, +2 LE edema)  Diet Order:  Diet regular Room service appropriate?: Yes; Fluid consistency::  Thin  EDUCATION NEEDS:  Education needs no appropriate at this time   Intake/Output Summary (Last 24 hours) at 02/08/15 1352 Last data filed at 02/08/15 0900  Gross per 24 hour  Intake   1200 ml  Output   1500 ml  Net   -300 ml    Last BM:  6/22  Corrin Parker, MS, RD, LDN Pager # [phone] After hours/ weekend pager # [phone]

## 2015-02-08 NOTE — Clinical Social Work Placement (Signed)
   CLINICAL SOCIAL WORK PLACEMENT  NOTE  Date:  02/08/2015  Patient Details  Name: Margaret Morgan MRN: [id number] Date of Birth: [birth year]  Clinical Social Work is seeking post-discharge placement for this patient at the [place] level of care (*CSW will initial, date and re-position this form in  chart as items are completed):  Yes   Patient/family provided with [facility] Work Department's list of facilities offering this level of care within the geographic area requested by the patient (or if unable, by the patient's family).  Yes   Patient/family informed of their freedom to choose among providers that offer the needed level of care, that participate in Medicare, Medicaid or managed care program needed by the patient, have an available bed and are willing to accept the patient.  Yes   Patient/family informed of [facility]'s ownership interest in [facility] and [facility], as well as of the fact that they are under no obligation to receive care at these facilities.  PASRR submitted to EDS on 02/06/15     PASRR number received on       Existing PASRR number confirmed on       FL2 transmitted to all facilities in geographic area requested by pt/family on 02/06/15     FL2 transmitted to all facilities within larger geographic area on 02/06/15     Patient informed that his/her managed care company has contracts with or will negotiate with certain facilities, including the following:        Yes   Patient/family informed of bed offers received.  Patient chooses bed at [facility]     Physician recommends and patient chooses bed at      Patient to be transferred to [facility] on 02/08/15.  Patient to be transferred to facility by Ambulance     Patient family notified on 02/08/15 of transfer.  Name of family member notified:  Manila Rommel (left message with patient brother - no return call at this  time)     PHYSICIAN       Additional Comment:    Barbette Or, O[place]

## 2015-02-08 NOTE — Progress Notes (Signed)
Hypoglycemic Event  CBG: 66  Treatment: 15 GM carbohydrate snack  Symptoms: None  Follow-up CBG: Time: 0221 CBG Result: 107  Possible Reasons for Event: Inadequate meal intake  Comments/MD notified: Hypoglycemic Protocol Initiated    Margaret Morgan V  Remember to initiate Hypoglycemia Order Set & complete

## 2015-02-08 NOTE — Discharge Instructions (Signed)
[facility] Stay Proper nutrition can help your body recover from illness and injury.   Foods and beverages high in protein, vitamins, and minerals help rebuild muscle loss, promote healing, & reduce fall risk. To aid in prevention of hypoglycemia, be sure to eating at least 3 meals a day or every 4-5 hours.   In addition to eating healthy foods, a nutrition shake is an easy, delicious way to get the nutrition you need during and after your hospital stay  It is recommended that you continue to drink Ensure if oral intake is inadequate to help maintain normal blood glucose and to help prevent hypoglycemia.  Tips for adding a nutrition shake into your routine: As allowed, drink one with vitamins or medications instead of water or juice Enjoy one as a tasty mid-morning or afternoon snack Drink cold or make a milkshake out of it Drink one instead of milk with cereal or snacks Use as a coffee creamer   Available at the following grocery stores and pharmacies:           * [employer] [phone]            For COUPONS visit: www.ensure.com/join or http://dawson-may.com/   Suggested Substitutions Ensure Plus = Boost Plus = Carnation Breakfast Essentials = Boost Compact Ensure Active Clear = Boost Breeze Glucerna Shake = Boost Glucose Control = Carnation Breakfast Essentials SUGAR FREE  Corrin Parker, MS, RD, LDN

## 2015-02-08 NOTE — Clinical Social Work Note (Signed)
Clinical Social Worker facilitated patient discharge including contacting patient family and facility to confirm patient discharge plans.  Clinical information faxed to facility and family agreeable with plan.  CSW arranged ambulance transport via [facility] to [facility].  RN to call report prior to discharge.  Clinical Social Worker will sign off for now as social work intervention is no longer needed. Please consult us again if new need arises.  Jesse Ronald Vinsant, LCSW [phone] 

## 2015-02-08 NOTE — Progress Notes (Signed)
Report given to nurse Herbie Baltimore at [facility].    Carole Civil, RN

## 2015-02-08 NOTE — Progress Notes (Signed)
02/08/2015 10:50 AM  Spoke to patient regarding care concerns and assisted in service recovery.  [employer], RN-BC, RN3 [facility] Phone [phone] Activate Charge Nurse (at this time)
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
## 2015-02-09 LAB — HIT PANEL (HEPARIN AB + SRA)
HEPARIN INDUCED PLT AB: 0.2 {OD_unit} (ref 0.000–0.400)
SRA .2 IU/mL UFH Ser-aCnc: 8 % (ref 0–20)
SRA 100IU/mL UFH Ser-aCnc: 15 % (ref 0–20)

## 2015-02-13 ENCOUNTER — Non-Acute Institutional Stay (SKILLED_NURSING_FACILITY): Payer: Medicare Other | Admitting: Adult Health

## 2015-02-13 DIAGNOSIS — E230 Hypopituitarism: Secondary | ICD-10-CM | POA: Diagnosis not present

## 2015-02-13 DIAGNOSIS — R569 Unspecified convulsions: Secondary | ICD-10-CM | POA: Diagnosis not present

## 2015-02-13 DIAGNOSIS — E038 Other specified hypothyroidism: Secondary | ICD-10-CM

## 2015-02-13 DIAGNOSIS — E7220 Disorder of urea cycle metabolism, unspecified: Secondary | ICD-10-CM | POA: Diagnosis not present

## 2015-02-13 DIAGNOSIS — F419 Anxiety disorder, unspecified: Secondary | ICD-10-CM | POA: Diagnosis not present

## 2015-02-13 DIAGNOSIS — E034 Atrophy of thyroid (acquired): Secondary | ICD-10-CM | POA: Diagnosis not present

## 2015-02-13 DIAGNOSIS — E232 Diabetes insipidus: Secondary | ICD-10-CM

## 2015-02-14 ENCOUNTER — Non-Acute Institutional Stay (SKILLED_NURSING_FACILITY): Payer: Medicare Other | Admitting: Internal Medicine

## 2015-02-14 ENCOUNTER — Encounter: Payer: Self-pay | Admitting: Internal Medicine

## 2015-02-14 ENCOUNTER — Encounter: Payer: Self-pay | Admitting: Adult Health

## 2015-02-14 DIAGNOSIS — E2740 Unspecified adrenocortical insufficiency: Secondary | ICD-10-CM

## 2015-02-14 DIAGNOSIS — E232 Diabetes insipidus: Secondary | ICD-10-CM | POA: Insufficient documentation

## 2015-02-14 DIAGNOSIS — F419 Anxiety disorder, unspecified: Secondary | ICD-10-CM | POA: Diagnosis not present

## 2015-02-14 DIAGNOSIS — E034 Atrophy of thyroid (acquired): Secondary | ICD-10-CM

## 2015-02-14 DIAGNOSIS — R569 Unspecified convulsions: Secondary | ICD-10-CM

## 2015-02-14 DIAGNOSIS — E038 Other specified hypothyroidism: Secondary | ICD-10-CM | POA: Diagnosis not present

## 2015-02-14 DIAGNOSIS — E230 Hypopituitarism: Secondary | ICD-10-CM | POA: Diagnosis not present

## 2015-02-14 DIAGNOSIS — D443 Neoplasm of uncertain behavior of pituitary gland: Secondary | ICD-10-CM | POA: Insufficient documentation

## 2015-02-14 DIAGNOSIS — F79 Unspecified intellectual disabilities: Secondary | ICD-10-CM | POA: Diagnosis not present

## 2015-02-14 NOTE — Progress Notes (Signed)
Patient ID: Margaret Morgan, female   DOB: 10/18/1980, 34 y.o.   MRN: [id number]    HISTORY AND PHYSICAL   DATE: 02/14/15  Location:  [facility]    Place of Service: SNF [phone])   Extended Emergency Contact Information Primary Emergency Contact: Heberlein,Michael  [place] Phone: [phone] Mobile Phone: [phone] Relation: Brother  Advanced Directive information  FULL CODE  Chief Complaint  Patient presents with  . 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 She is mentally challenged and is a poor historian. Hx obtained from chart. She lives with her brother. Hospital records reviewed. She spent some time in the ICU due to VDRF with hypoxia. She was given stress doses of steroids. At d/c, she was taking hydrocortisone BID. She was given diflucan for yeast infection. She also hs prn HFA for wheezing  Today, she has no c/o. She is eating and sleeping well. No nursing issues. No sz activity or low BS reactions. CBG 110 today. She is taking desmopressin. VSS  Thyroid stable on levothyroxine  She takes keppra and dilantin for sz d/o  Mood stable on lorazepam and paroxetine  She has GERD and takes protonix   Past Medical History  Diagnosis Date  . Mental retardation   . Hypothyroid   . Anxiety   . Seizures   . Pituitary insufficiency   . Brain neoplasm     Past Surgical History  Procedure Laterality Date  . Shunt replacement      Brain    No care team member to display  History   Social History  . Marital Status: Single    Spouse Name: N/A  . Number of Children: N/A  . Years of Education: N/A   Occupational History  . Not on file.   Social History Main Topics  . Smoking status: Never Smoker   .  Smokeless tobacco: Not on file  . Alcohol Use: No  . Drug Use: Not on file  . Sexual Activity: Not on file   Other Topics Concern  . Not on file   Social History Narrative     reports that she has never smoked. She does not have any smokeless tobacco history on file. She reports that she does not drink alcohol. Her drug history is not on file.  No family history on file. No family status information on file.     There is no immunization history on file for this patient.  No Known Allergies  Medications: Patient's Medications  New Prescriptions   No medications on file  Previous Medications   ALBUTEROL (PROVENTIL HFA;VENTOLIN HFA) 108 (90 BASE) MCG/ACT INHALER    Inhale 1-2 puffs into the lungs every 6 (six) hours as needed for wheezing or shortness of breath.   CALCIUM CARB-CHOLECALCIFEROL (CALCIUM 600 + D) 600-200 MG-UNIT TABS    Take 1 tablet by mouth daily.   DESMOPRESSIN (DDAVP) 0.1 MG TABLET    Take 0.1 mg by mouth daily.   HYDROCORTISONE (CORTEF) 10 MG TABLET    Take 2.5 tablets (25 mg total) by mouth 2 (two) times daily.   LACTULOSE (CHRONULAC) 10 GM/15ML SOLUTION    Take 15 mLs (10 g total) by mouth 2 (two) times daily.   LEVETIRACETAM (KEPPRA) 250 MG TABLET  Take 1 tablet (250 mg total) by mouth 2 (two) times daily.   LEVOTHYROXINE (SYNTHROID, LEVOTHROID) 100 MCG TABLET    Take 1 tablet (100 mcg total) by mouth daily before breakfast.   LORAZEPAM (ATIVAN) 1 MG TABLET    Take 0.5-1 mg by mouth 3 (three) times daily. 1mg  at 8am and 8pm, 0.5mg  at 1pm   OMEGA-3 FATTY ACIDS (FISH OIL) 1000 MG CAPS    Take 1,000 mg by mouth 2 (two) times daily.   PANTOPRAZOLE (PROTONIX) 40 MG TABLET    Take 40 mg by mouth daily.   PAROXETINE (PAXIL) 20 MG TABLET    Take 20 mg by mouth daily.   PHENYTOIN (DILANTIN) 100 MG ER CAPSULE    Take 1 capsule (100 mg total) by mouth daily.  Modified Medications   No medications on file  Discontinued Medications   No medications on file     Review of Systems  Unable to perform ROS: Psychiatric disorder    Filed Vitals:   02/14/15 1735  BP: 129/71  Pulse: 86  Temp: 97.1 F (36.2 C)  Weight: 200 lb (90.719 kg)  SpO2: 95%   Body mass index is 34.31 kg/(m^2).  Physical Exam  Constitutional: She appears well-developed and well-nourished.  HENT:  Mouth/Throat: Oropharynx is clear and moist. Abnormal dentition. Dental caries present. No oropharyngeal exudate.  Eyes: Pupils are equal, round, and reactive to light. No scleral icterus.  Neck: Neck supple. Carotid bruit is not present. No tracheal deviation present. No thyromegaly present.  Cardiovascular: Normal rate, regular rhythm and intact distal pulses.  Exam reveals no friction rub. Gallop: 1/6 SEM.   Murmur heard. No LE edema b/l. no calf TTP.   Pulmonary/Chest: Effort normal and breath sounds normal. No stridor. No respiratory distress. She has no wheezes. She has no rales.  Abdominal: Soft. Bowel sounds are normal. She exhibits no distension and no mass. There is no hepatomegaly. There is no tenderness. There is no rebound and no guarding.  Musculoskeletal: She exhibits edema and tenderness.  Lymphadenopathy:    She has no cervical adenopathy.  Neurological: She is alert.  Skin: Skin is warm and dry. No rash noted.  Psychiatric: She has a normal mood and affect. Her behavior is normal. Judgment normal.     Labs reviewed: Admission on 01/30/2015, Discharged on 02/08/2015  No results displayed because visit has over 200 results.     CBC Latest Ref Rng 02/08/2015 02/07/2015 02/06/2015  WBC 4.0 - 10.5 K/uL 8.1 7.6 7.6  Hemoglobin 12.0 - 15.0 g/dL 8.0(L) 9.1(L) 8.8(L)  Hematocrit 36.0 - 46.0 % 23.7(L) 27.4(L) 26.5(L)  Platelets 150 - 400 K/uL 106(L) 56(L) 33(L)    CMP Latest Ref Rng 02/07/2015 02/06/2015 02/05/2015  Glucose 65 - 99 mg/dL 122(H) 91 80  BUN 6 - 20 mg/dL 47(H) 47(H) 46(H)  Creatinine 0.44 - 1.00 mg/dL 1.96(H) 1.89(H) 1.85(H)  Sodium 135 - 145  mmol/L 144 143 147(H)  Potassium 3.5 - 5.1 mmol/L 3.5 3.7 4.1  Chloride 101 - 111 mmol/L 110 110 116(H)  CO2 22 - 32 mmol/L 27 26 25   Calcium 8.9 - 10.3 mg/dL 8.7(L) 8.5(L) 8.4(L)  Total Protein 6.5 - 8.1 g/dL 5.8(L) 5.9(L) -  Total Bilirubin 0.3 - 1.2 mg/dL 0.3 0.4 -  Alkaline Phos 38 - 126 U/L 101 85 -  AST 15 - 41 U/L 39 16 -  ALT 14 - 54 U/L 24 14 -    No results found for: HGBA1C Heparin Induced Plt  Ab 0.000 - 0.400 OD 0.200   SRA .2 IU/mL UFH Ser-aCnc 0 - 20 % 8   SRA 100IU/mL UFH Ser-aCnc 0 - 20 % 15   SRA UFH Ser-Imp  Comment   Comments: (NOTE)  SRA Result: NEGATIVE               Ref Range 8d ago  9d ago  12d ago    Ammonia 9 - 35 umol/L 25 46 (H) 68 (H) (down from 154 01/31/15)  Resulting Agency  [facility]    Specimen Collected: 02/06/15 6:21 AM Last Resulted: 02/06/15 7:36 AM       Phenytoin Lvl 10.0 - 20.0 ug/mL 16.9 15.9 14.1    Resulting Agency  [facility]      Specimen Collected: 02/06/15 6:21 AM Last Resulted: 02/06/15 7:32 AM          Folate >5.9 ng/mL 11.2 5.9 ng/mL" class="[email or web address]" style="cursor: pointer;" onmouseover='jscript: var varStyle="underline"; var bgColor="#[id number]"; this.style.backgroundColor=bgColor; var children=this.getElementsByTagName("div"); for(var child=0;child 5.8 (L)   Resulting Agency  [facility]      Specimen Collected: 02/06/15 6:21 AM Last Resulted: 02/06/15 7:59 AM        Cortisol - AM 6.7 - 22.6 ug/dL 8.0 17.4 >100.0 (H)CM    Resulting Agency  [facility]      Specimen Collected: 02/06/15 6:21 AM Last Resulted: 02/06/15 7:54 AM         2wk ago (01/31/15) 2wk ago (01/31/15) 2wk ago (01/30/15)       pH, Arterial 7.350 - 7.450  7.480 (H) 7.449 7.404    pCO2 arterial 35.0 - 45.0 mmHg 31.7 (L) 39.5 49.1 (H)    pO2, Arterial 80.0 - 100.0 mmHg 139.0 (H) 84.4 28.0 (LL)    Bicarbonate 20.0 - 24.0 mEq/L 23.8 27.0 (H) 32.3 (H)    TCO2 0 - 100  mmol/L 25 28.2 34    O2 Saturation % 99.0 96.8 68.0    Patient temperature  97.3 F 98.6 90.2 F    Collection site  RADIAL, ALLEN'S TEST ACCEP... RIGHT RADIAL RADIAL, ALLEN'S TEST ACCEP...   RADIAL, ALLEN'S TEST ACCEPTABLE    Drawn by  RT [phone] Operator    Sample type  ARTERIAL ARTERIAL DRAW ARTERIAL   Resulting Agency  [id number]      Specimen Collected: 01/31/15 11:57 PM Last Resulted: 01/31/15 11:59 PM          Ref Range 2wk ago (01/31/15) 2wk ago (01/30/15)     TSH 0.350 - 4.500 uIU/mL 0.192 (L) 0.153 (L)   Resulting Agency  [id number]      Specimen Collected: 01/31/15 7:00 PM Last Resulted: 01/31/15 10:11 PM          Dg Knee 1-2 Views Right  02/07/2015   CLINICAL DATA:  Fall.  EXAM: RIGHT KNEE - 1-2 VIEW  COMPARISON:  02/28/2010  FINDINGS: There is no evidence of fracture, dislocation, or joint effusion. There is no evidence of arthropathy or other focal bone abnormality. Soft tissues are unremarkable.  Osteopenia and muscular atrophy, likely disuse.  IMPRESSION: No acute findings.   Electronically Signed   By: Monte Fantasia M.D.   On: 02/07/2015 12:36   Ct Head Wo Contrast  01/30/2015   ADDENDUM REPORT: 01/30/2015 18:19  ADDENDUM: This addendum is given for the purpose of noting the patient has a left mastoid effusion which is new since the comparison examination.   Electronically Signed   By: Inge Rise M.D.  On: 01/30/2015 18:19   01/30/2015   CLINICAL DATA:  Patient unresponsive.  EXAM: CT HEAD WITHOUT CONTRAST  TECHNIQUE: Contiguous axial images were obtained from the base of the skull through the vertex without intravenous contrast.  COMPARISON:  Head CT scan 10/29/2014 and 05/07/2011.  FINDINGS: As on the most recent examination, 3 ventriculostomy shunt catheters, 1 from a right frontal approach and two from a left frontal approach are identified. Encephalomalacia in the right frontal lobe is unchanged. Expansion of the sella  turcica with a calcified lesion identified and a cystic collection to the right of the sella are unchanged in appearance. No evidence of acute infarction, hemorrhage, midline shift or abnormal extra-axial fluid collection is identified. No hydrocephalus or pneumocephalus. The calvarium is intact.  IMPRESSION: No acute abnormality.  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  Electronically Signed: By: Inge Rise M.D. On: 01/30/2015 18:13   [place] Art/ven Flow Abd Pelv Doppler Limited  02/01/2015   CLINICAL DATA:  Liver failure.  EXAM: US ABDOMEN LIMITED - RIGHT UPPER QUADRANT  COMPARISON:  None.  FINDINGS: Gallbladder:  No gallstones or wall thickening visualized. No sonographic Murphy sign noted.  Common bile duct:  Diameter: 4.6 mm  Liver:  No focal lesion identified. Within normal limits in parenchymal echogenicity. Mild ascites and mild pleural effusions.  IMPRESSION: Mild ascites and bilateral effusions, otherwise negative exam.   Electronically Signed   By: Marcello Moores  Register   On: 02/01/2015 12:24   Dg Chest Port 1 View  02/02/2015   CLINICAL DATA:  Respiratory failure.  EXAM: PORTABLE CHEST - 1 VIEW  COMPARISON:  02/01/2015.  FINDINGS: Endotracheal tube tip 1.7 cm above the carina. NG tube coiled in stomach. Left IJ line in stable position with tip at the cavoatrial junction. Heart size normal. Low lung volumes with right base subsegmental atelectasis and/or mild infiltrate. No pleural effusion or pneumothorax.  IMPRESSION: 1. Endotracheal tube tip 1.7 cm above the carina. NG tube and left IJ line in stable position. 2. Low lung volumes with mild right base subsegmental atelectasis and or infiltrate .   Electronically Signed   By: [place]   On: 02/02/2015 07:16   Dg Chest Port 1 View  02/01/2015   CLINICAL DATA:  Intubation.  EXAM: PORTABLE CHEST - 1 VIEW  COMPARISON:  None.  FINDINGS: Endotracheal tube, NG tube, left  IJ line stable position. Mediastinum hilar structures are stable. Low lung volumes with mild left base subsegmental atelectasis again noted. Heart size normal. No pleural effusion or pneumothorax.  IMPRESSION: Lines and tubes in stable position. Low lung volumes with mild left base subsegmental atelectasis again noted.   Electronically Signed   By: Marcello Moores  Register   On: 02/01/2015 07:06   Dg Chest Port 1 View  01/31/2015   CLINICAL DATA:  Hypoxia  EXAM: PORTABLE CHEST - 1 VIEW  COMPARISON:  Study obtained earlier in the day  FINDINGS: Endotracheal tube tip is at the carina. Nasogastric tube tip and side port are in the stomach. Central catheter tip is at the cavoatrial junction. No pneumothorax. There is new left base atelectasis. Lungs are otherwise clear. Heart size and pulmonary vascularity are normal.  IMPRESSION: Tube and catheter positions as described without pneumothorax. Note that the endotracheal tube tip is at the carina. There is new left base atelectasis.  These results were called by telephone at the time of interpretation on 01/31/2015 at 7:01 pm  to Brunetta Genera, RN, who verbally acknowledged these results.   Electronically Signed   By: Lowella Grip III M.D.   On: 01/31/2015 19:02   Portable Chest 1 View  01/31/2015   CLINICAL DATA:  Hypoxia.  EXAM: PORTABLE CHEST - 1 VIEW  COMPARISON:  None.  FINDINGS: Left IJ line in stable position. Mediastinum and hilar structures stable. Heart size stable. Low lung volumes. A mild infiltrate right lung base cannot be completely excluded. Follow-up chest x-ray suggested. No pleural effusion or pneumothorax.  IMPRESSION: 1. Left IJ line in stable position. 2. Low lung volumes. A mild developing infiltrate right lung base cannot be completely excluded. Follow-up chest x-rays suggested .   Electronically Signed   By: Marcello Moores  Register   On: 01/31/2015 08:10   Dg Chest Port 1 View  01/31/2015   CLINICAL DATA:  Central line placement.  EXAM: PORTABLE  CHEST - 1 VIEW  COMPARISON:  01/30/2015  FINDINGS: Shallow inspiration. Left central venous catheter placed with tip over the cavoatrial junction. No pneumothorax. The heart size and mediastinal contours are within normal limits. Both lungs are clear. The visualized skeletal structures are unremarkable.  IMPRESSION: Left central venous catheter appears in satisfactory location. No evidence of active pulmonary disease.   Electronically Signed   By: Lucienne Capers M.D.   On: 01/31/2015 00:26   Dg Chest Port 1 View  01/30/2015   CLINICAL DATA:  Altered mental status, mental retardation  EXAM: PORTABLE CHEST - 1 VIEW  COMPARISON:  None.  FINDINGS: Cardiomediastinal silhouette is unremarkable. There is poor inspiration. No acute infiltrate or pleural effusion. No pulmonary edema. Mild basilar atelectasis.  IMPRESSION: No active disease.   Electronically Signed   By: Lahoma Crocker M.D.   On: 01/30/2015 12:09   US Abdomen Limited Ruq  02/01/2015   CLINICAL DATA:  Liver failure.  EXAM: US ABDOMEN LIMITED - RIGHT UPPER QUADRANT  COMPARISON:  None.  FINDINGS: Gallbladder:  No gallstones or wall thickening visualized. No sonographic Murphy sign noted.  Common bile duct:  Diameter: 4.6 mm  Liver:  No focal lesion identified. Within normal limits in parenchymal echogenicity. Mild ascites and mild pleural effusions.  IMPRESSION: Mild ascites and bilateral effusions, otherwise negative exam.   Electronically Signed   By: Marcello Moores  Register   On: 02/01/2015 12:24     Assessment/Plan   ICD-9-CM ICD-10-CM   1. Pituitary deficiency - stable; due to #2 253.2 E23.0   2. Rathke's pouch tumor 237.0 D44.3   3. Seizures - stable 780.39 R56.9   4. Adrenal cortical insufficiency - stable; crisis resolved 255.41 E27.40   5. Hypothyroidism due to acquired atrophy of thyroid - stable 244.8 E03.8    246.8 E03.4   6. Anxiety - controlled 300.00 F41.9   7. Mentally challenged 319 F79     --check CBC w diff, CMP and dilantin level    --cont PT/OT/ST as ordered  --check CBGs BID for hypoglycemia monitoring  --cont current meds as ordered  --GOAL: short term rehab and d/c home when medically appropriate. Communicated with pt and nursing.  --will follow  Floella Ensz S. Perlie Gold  [facility] [address] [place], [place] [address] [phone] Cell (Monday-Friday 8 AM - 5 PM) [phone] After 5 PM and follow prompts

## 2015-02-14 NOTE — Progress Notes (Signed)
Patient ID: Margaret Morgan, female   DOB: 1981/07/14, 34 y.o.   MRN: [id number]  [facility] Gemma living [place]     No Known Allergies     Chief Complaint  Patient presents with  . Hospitalization Follow-up    HPI:  She has been hospitalized due to an acute adrenal crisis; hyperammonemia; panhypopituitarism. She had recently been treated for pneumonia prior to her hospitalized. She is here for short term rehab with her goal to return to prior living environment.    Past Medical History  Diagnosis Date  . Mental retardation   . Hypothyroid   . Anxiety   . Seizures   . Pituitary insufficiency   . Brain neoplasm     Past Surgical History  Procedure Laterality Date  . Shunt replacement      Brain    VITAL SIGNS BP 144/79 mmHg  Pulse 78  Ht 5\' 4"  (1.626 m)  Wt 200 lb (90.719 kg)  BMI 34.31 kg/m2  LMP  (LMP Unknown)   Outpatient Encounter Prescriptions as of 02/13/2015  Medication Sig  . albuterol (PROVENTIL HFA;VENTOLIN HFA) 108 (90 BASE) MCG/ACT inhaler Inhale 1-2 puffs into the lungs every 6 (six) hours as needed for wheezing or shortness of breath.  . Calcium Carb-Cholecalciferol (CALCIUM 600 + D) 600-200 MG-UNIT TABS Take 1 tablet by mouth daily.  Marland Kitchen desmopressin (DDAVP) 0.1 MG tablet Take 0.1 mg by mouth daily.  . hydrocortisone (CORTEF) 10 MG tablet Take 2.5 tablets (25 mg total) by mouth 2 (two) times daily.  Marland Kitchen lactulose (CHRONULAC) 10 GM/15ML solution Take 15 mLs (10 g total) by mouth 2 (two) times daily.  Marland Kitchen levETIRAcetam (KEPPRA) 250 MG tablet Take 1 tablet (250 mg total) by mouth 2 (two) times daily.  Marland Kitchen levothyroxine (SYNTHROID, LEVOTHROID) 100 MCG tablet Take 1 tablet (100 mcg total) by mouth daily before breakfast.  . LORazepam (ATIVAN) 1 MG tablet Take 0.5-1 mg by mouth 3 (three) times daily. 1mg  at 8am and 8pm, 0.5mg  at 1pm  . Omega-3 Fatty Acids (FISH OIL) 1000 MG CAPS Take 1,000 mg by mouth 2 (two) times daily.  . pantoprazole (PROTONIX) 40 MG tablet Take 40 mg  by mouth daily.  Marland Kitchen PARoxetine (PAXIL) 20 MG tablet Take 20 mg by mouth daily.  . phenytoin (DILANTIN) 100 MG ER capsule Take 1 capsule (100 mg total) by mouth daily.      SIGNIFICANT DIAGNOSTIC EXAMS  01-30-15: chest x-ray: No active disease.  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  02-01-15 right upper quad ultrasound: Mild ascites and bilateral effusions, otherwise negative exam.    LABS REVIEWED:   01-30-15: wbc 3.3; hgb 12.4; hct 35.6; mcv 89.0; plt clumped; glucose 115; bun 14; creat 0.45; k+4.2; na++131; liver normal albumin 3.0; dilantin 1.41; free dilantin 2.7; depakote 49; free t4: 0.4; tsh 0.153; folate 5.8; vit b12: 893; phos 2.6; mag 1.4  01-31-15: cortisol 19.6; ammonia 154; free t4: 0.6; tsh 0.193 02-01-15: wbc 6.0; hgb 11.0; hct 31.4; mcv 87.0; plt 100; glucose 100; bun 13; creat 1.45; k+ 3.2; na++140; cortisol >100.0; ammonia 104 02-06-15: wbc 7.6 hgb 8.8; hct 26.5; mcv 91.4; plt 33; glucose 91; bun 47; creat 1.89; k+3.7; na++143; liver normal albumin 2.5; ammonia 25; mag 1.9; dilantin 16.9; vit d 55.5; folate 11.2; iron 59; tibc 211; phos 5.5      Review of Systems  Constitutional: Negative for malaise/fatigue.  Respiratory: Negative  for cough.   Gastrointestinal: Negative for abdominal pain.  Musculoskeletal: Negative for myalgias and joint pain.  Skin: Negative.   Psychiatric/Behavioral: The patient is not nervous/anxious.      Physical Exam  Constitutional: No distress.  Obese   Neck: Neck supple. No JVD present.  Cardiovascular: Normal rate, regular rhythm and intact distal pulses.   Respiratory: Effort normal and breath sounds normal. No respiratory distress.  GI: Soft. Bowel sounds are normal. She exhibits no distension. There is no tenderness.  Musculoskeletal:  Is able to move all extremities Has 1+ bilateral lower extremity edema    Neurological: She is alert.  Skin: Skin is warm and dry. She is not diaphoretic.     ASSESSMENT/ PLAN:  1.  Seizure: no reports of seizure activity. Her depakote was stopped while hospitalized. Will continue keppra 250 mg twice daily and dilantin 100 mg daily and will monitor her status.   2. Hyperammonemia: will continue her lactulose 15 cc twice daily and will monitor   3. 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   4. Hypothyroidism: will continue synthroid 100 mcg daily   5. Anxiety: will continue paxil 20 mg daily and ativan 1 mg twice daily and 0.5 mg in the afternoon  6. Adrenal insuffiencey: will continue cortef 25 mg twice daily and will monitor   7. Jerrye Bushy will continue protonix 40 mg daily   8. Dyslipidemia: will continue fish oil 1 gm twice daily     Time spent with patient 50 minutes.    Ok Edwards NP [facility]  Contact [phone] Monday through Friday 8am- 5pm  After hours call [phone]

## 2015-03-17 ENCOUNTER — Non-Acute Institutional Stay (SKILLED_NURSING_FACILITY): Payer: Medicare Other | Admitting: Adult Health

## 2015-03-17 DIAGNOSIS — F419 Anxiety disorder, unspecified: Secondary | ICD-10-CM

## 2015-03-17 DIAGNOSIS — E232 Diabetes insipidus: Secondary | ICD-10-CM

## 2015-03-17 DIAGNOSIS — E230 Hypopituitarism: Secondary | ICD-10-CM

## 2015-03-17 DIAGNOSIS — R569 Unspecified convulsions: Secondary | ICD-10-CM

## 2015-03-17 DIAGNOSIS — E7220 Disorder of urea cycle metabolism, unspecified: Secondary | ICD-10-CM

## 2015-06-18 ENCOUNTER — Encounter: Payer: Self-pay | Admitting: Adult Health

## 2015-06-18 NOTE — Progress Notes (Signed)
Patient ID: Margaret Morgan, female   DOB: Dec 26, 1980, 34 y.o.   MRN: [id number]    Facility: [facility]      No Known Allergies  Chief Complaint  Patient presents with  . Medical Management of Chronic Issues    HPI:  She is a resident of this facility being seen for the management of her chronic illnesses. Overall she is doing well. She is not voicing any concerns or complaints at this time. There are no nursing concerns at this time.    Past Medical History  Diagnosis Date  . Mental retardation   . Hypothyroid   . Anxiety   . Seizures ([place])   . 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 Brain neoplasm [facility])     Past Surgical History  Procedure Laterality Date  . Shunt replacement      Brain    VITAL SIGNS BP 122/54 mmHg  Pulse 68  Ht 5\' 4"  (1.626 m)  Wt 185 lb (83.915 kg)  BMI 31.74 kg/m2  Patient's Medications  New Prescriptions   No medications on file  Previous Medications   ALBUTEROL (PROVENTIL HFA;VENTOLIN HFA) 108 (90 BASE) MCG/ACT INHALER    Inhale 1-2 puffs into the lungs every 6 (six) hours as needed for wheezing or shortness of breath.   CALCIUM CARB-CHOLECALCIFEROL (CALCIUM 600 + D) 600-200 MG-UNIT TABS    Take 1 tablet by mouth daily.   DESMOPRESSIN (DDAVP) 0.1 MG TABLET    Take 0.1 mg by mouth daily.   HYDROCORTISONE (CORTEF) 10 MG TABLET    Take 2.5 tablets (25 mg total) by mouth 2 (two) times daily.   LACTULOSE (CHRONULAC) 10 GM/15ML SOLUTION    Take 15 mLs (10 g total) by mouth 2 (two) times daily.   LEVETIRACETAM (KEPPRA) 250 MG TABLET    Take 1 tablet (250 mg total) by mouth 2 (two) times daily.   LEVOTHYROXINE (SYNTHROID, LEVOTHROID) 100 MCG TABLET    Take 1 tablet (100 mcg total) by mouth daily before breakfast.   LORAZEPAM (ATIVAN) 1 MG TABLET    Take 0.5-1 mg by mouth 3 (three) times daily. 1mg  at 8am and 8pm, 0.5mg  at 1pm   OMEGA-3 FATTY ACIDS (FISH OIL) 1000 MG CAPS    Take 1,000 mg by mouth 2 (two) times daily.   PANTOPRAZOLE (PROTONIX)  40 MG TABLET    Take 40 mg by mouth daily.   PAROXETINE (PAXIL) 20 MG TABLET    Take 20 mg by mouth daily.   PHENYTOIN (DILANTIN) 100 MG ER CAPSULE    Take 1 capsule (100 mg total) by mouth daily.  Modified Medications   No medications on file  Discontinued Medications   No medications on file     SIGNIFICANT DIAGNOSTIC EXAMS   01-30-15: chest x-ray: No active disease.  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  02-01-15 right upper quad ultrasound: Mild ascites and bilateral effusions, otherwise negative exam.    LABS REVIEWED:   01-30-15: wbc 3.3; hgb 12.4; hct 35.6; mcv 89.0; plt clumped; glucose 115; bun 14; creat 0.45; k+4.2; na++131; liver normal albumin 3.0; dilantin 1.41; free dilantin 2.7; depakote 49; free t4: 0.4; tsh 0.153; folate 5.8; vit b12: 893; phos 2.6; mag 1.4  01-31-15: cortisol 19.6; ammonia 154; free t4: 0.6; tsh 0.193 02-01-15: wbc 6.0; hgb 11.0; hct 31.4; mcv 87.0; plt 100; glucose 100; bun 13; creat 1.45; k+ 3.2;  na++140; cortisol >100.0; ammonia 104 02-06-15: wbc 7.6 hgb 8.8; hct 26.5; mcv 91.4; plt 33; glucose 91; bun 47; creat 1.89; k+3.7; na++143; liver normal albumin 2.5; ammonia 25; mag 1.9; dilantin 16.9; vit d 55.5; folate 11.2; iron 59; tibc 211; phos 5.5 02-15-15: wbc 5.1 ;hgb 8.3; hct 24.1; mcv 87.0; plt 345; glucose 66; bun 29; creat 1.15; k+ 4.4; na++139; liver normal albumin 3.4; dilantin 4.7  03-01-15: dilantin 6.3        Review of Systems Constitutional: Negative for malaise/fatigue.  Respiratory: Negative for cough.   Gastrointestinal: Negative for abdominal pain.  Musculoskeletal: Negative for myalgias and joint pain.  Skin: Negative.   Psychiatric/Behavioral: The patient is not nervous/anxious.      Physical Exam Constitutional: No distress.  Obese   Neck: Neck supple. No JVD present.  Cardiovascular: Normal rate, regular  rhythm and intact distal pulses.   Respiratory: Effort normal and breath sounds normal. No respiratory distress.  GI: Soft. Bowel sounds are normal. She exhibits no distension. There is no tenderness.  Musculoskeletal:  Is able to move all extremities Has 1+ bilateral lower extremity edema  Neurological: She is alert.  Skin: Skin is warm and dry. She is not diaphoretic.     ASSESSMENT/ PLAN:  1.  Seizure: no reports of seizure activity. Her depakote was stopped while hospitalized. Will continue keppra 250 mg twice daily and dilantin 100 mg daily and will monitor her status.   2. Hyperammonemia: will continue her lactulose 15 cc twice daily and will monitor   3. 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   4. Hypothyroidism: will continue synthroid 100 mcg daily   5. Anxiety: will continue paxil 20 mg daily and ativan 1 mg twice daily and 0.5 mg in the afternoon  6. Adrenal insuffiencey: will continue cortef 25 mg twice daily and will monitor   7. Jerrye Bushy will continue protonix 40 mg daily   8. Dyslipidemia: will continue fish oil 1 gm twice daily        Ok Edwards NP [facility]  Contact [phone] Monday through Friday 8am- 5pm  After hours call [phone]

## 2015-12-28 ENCOUNTER — Ambulatory Visit (INDEPENDENT_AMBULATORY_CARE_PROVIDER_SITE_OTHER): Payer: Medicare Other | Admitting: Sports Medicine

## 2015-12-28 ENCOUNTER — Encounter: Payer: Self-pay | Admitting: Sports Medicine

## 2015-12-28 DIAGNOSIS — M216X9 Other acquired deformities of unspecified foot: Secondary | ICD-10-CM | POA: Diagnosis not present

## 2015-12-28 DIAGNOSIS — I739 Peripheral vascular disease, unspecified: Secondary | ICD-10-CM | POA: Diagnosis not present

## 2015-12-28 DIAGNOSIS — M2170 Unequal limb length (acquired), unspecified site: Secondary | ICD-10-CM | POA: Diagnosis not present

## 2015-12-28 DIAGNOSIS — E232 Diabetes insipidus: Secondary | ICD-10-CM | POA: Diagnosis not present

## 2015-12-28 NOTE — Progress Notes (Signed)
Patient ID: Margaret Morgan, female   DOB: January 23, 1981, 35 y.o.   MRN: [id number] Subjective: Margaret Morgan is a 35 y.o. 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 Patient denies any new changes in medication or new problems. Patient denies any new cramping, numbness, burning or tingling in the legs.  Patient Active Problem List   Diagnosis Date Noted  . Diabetes insipidus ([place]) 02/14/2015  . Anxiety 02/14/2015  . Mentally challenged 02/14/2015  . 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 Hypoxia   . Hyperammonemia ([place])   . Pressure ulcer 01/31/2015  . 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 Hypothermia 01/31/2015  . Hypothyroidism 01/31/2015  . Seizures ([place]) 01/31/2015  . Acute encephalopathy   . Acute respiratory failure with hypoxia ([place])   . Aspiration pneumonia ([place])   . Sepsis ([place]) 01/30/2015  . Hallucination 11/12/2013  . Intellectual disability 11/12/2013  . Neoplasm of brain ([place]) 11/12/2013  . Partial epilepsy with impairment of consciousness ([place]) 11/12/2013   Current Outpatient Prescriptions on File Prior to Visit  Medication Sig Dispense Refill  . albuterol (PROVENTIL HFA;VENTOLIN HFA) 108 (90 BASE) MCG/ACT inhaler Inhale 1-2 puffs into the lungs every 6 (six) hours as needed for wheezing or shortness of breath.    . Calcium Carb-Cholecalciferol (CALCIUM 600 + D) 600-200 MG-UNIT TABS Take 1 tablet by mouth daily.    Marland Kitchen desmopressin (DDAVP) 0.1 MG tablet Take 0.1 mg by mouth daily.    . hydrocortisone (CORTEF) 10 MG tablet Take 2.5 tablets (25 mg total) by mouth 2 (two) times daily. 150 tablet 3  . lactulose (CHRONULAC) 10 GM/15ML solution Take 15 mLs (10 g total) by mouth 2 (two) times daily. 240 mL 0  . levETIRAcetam (KEPPRA) 250 MG tablet Take 1 tablet (250 mg total)  by mouth 2 (two) times daily. 60 tablet 3  . levothyroxine (SYNTHROID, LEVOTHROID) 100 MCG tablet Take 1 tablet (100 mcg total) by mouth daily before breakfast. 30 tablet 3  . LORazepam (ATIVAN) 1 MG tablet Take 0.5-1 mg by mouth 3 (three) times daily. 1mg  at 8am and 8pm, 0.5mg  at 1pm    . Omega-3 Fatty Acids (FISH OIL) 1000 MG CAPS Take 1,000 mg by mouth 2 (two) times daily.    . pantoprazole (PROTONIX) 40 MG tablet Take 40 mg by mouth daily.    Marland Kitchen PARoxetine (PAXIL) 20 MG tablet Take 20 mg by mouth daily.    . phenytoin (DILANTIN) 100 MG ER capsule Take 1 capsule (100 mg total) by mouth daily. 30 capsule 3   No current facility-administered medications on file prior to visit.   No Known Allergies  No results found for this or any previous visit (from the past 2160 hour(s)).  Objective: General: Patient is awake, alert, and oriented x 3 and in no acute distress in wheelchair.  Integument: Skin is dry and supple bilateral. Nails are short anddystrophic with subungual debris, consistent with onychomycosis, 1-5 bilateral. No signs of infection. No open lesions or preulcerative lesions present bilateral. Remaining integument unremarkable.  Vasculature:  Dorsalis Pedis pulse 1/4 bilateral. Posterior Tibial pulse  0/4 bilateral.  Capillary fill time <5 sec 1-5 bilateral. No hair growth to the level of the digits. Temperature gradient decreased bilateral. No varicosities present bilateral. 1+ pitting edema present bilateral.   Neurology: The  patient has intact sensation measured with a 5.07/10g Semmes Weinstein Monofilament at all pedal sites bilateral . Vibratory sensation diminished bilateral with tuning fork. No Babinski sign present bilateral.   Musculoskeletal:Cavovarus foot type noted bilateral. Possible >1 inch LLD L>R. Muscular strength 4/5 in all lower extremity muscular groups bilateral without pain on range of motion . No tenderness with calf compression bilateral.  Assessment and  Plan: Problem List Items Addressed This Visit      Endocrine   Diabetes insipidus ([place]) - Primary    Other Visit Diagnoses    PVD (peripheral vascular disease) ([place])        Cavovarus deformity of foot, acquired, unspecified laterality        Lower limb length difference          -Examined patient. -Discussed and educated patient on foot care, especially with  regards to the vascular, neurological and musculoskeletal systems.  -Modified and glues soles back together on current shoes -[employer] step diabetic shoe order form was completed; office to contact primary care for approval / certification;  Office to arrange shoe fitting and dispensing with [facility] for custom shoes with lift -Answered all patient questions -Patient to return for shoe fitting/measurements 6-14 with Benjie Karvonen  -Patient advised to call the office if any problems or questions arise in the meantime.  Landis Martins, DPM

## 2016-01-31 ENCOUNTER — Other Ambulatory Visit: Payer: Medicare Other | Admitting: Sports Medicine

## 2016-04-18 ENCOUNTER — Other Ambulatory Visit: Payer: Self-pay

## 2016-04-18 MED ORDER — OXYCODONE-ACETAMINOPHEN 5-325 MG PO TABS: 1.0000 | ORAL_TABLET | ORAL | 0 refills | Status: DC | PRN

## 2016-04-18 NOTE — Telephone Encounter (Signed)
Faxed to [facility] Fax Number: [phone], Phone Number [phone]  

## 2017-05-02 ENCOUNTER — Inpatient Hospital Stay
Admission: AD | Admit: 2017-05-02 | Payer: Self-pay | Source: Other Acute Inpatient Hospital | Admitting: Internal Medicine

## 2017-05-02 DIAGNOSIS — E871 Hypo-osmolality and hyponatremia: Secondary | ICD-10-CM | POA: Diagnosis not present

## 2017-05-02 DIAGNOSIS — I959 Hypotension, unspecified: Secondary | ICD-10-CM

## 2017-05-02 DIAGNOSIS — I4891 Unspecified atrial fibrillation: Secondary | ICD-10-CM | POA: Diagnosis not present

## 2017-05-02 DIAGNOSIS — E876 Hypokalemia: Secondary | ICD-10-CM

## 2017-06-29 ENCOUNTER — Inpatient Hospital Stay (HOSPITAL_COMMUNITY)
Admission: EM | Admit: 2017-06-29 | Discharge: 2017-07-19 | DRG: 871 | Disposition: E | Payer: Medicare Other | Attending: Pulmonary Disease | Admitting: Pulmonary Disease

## 2017-06-29 DIAGNOSIS — R6521 Severe sepsis with septic shock: Secondary | ICD-10-CM | POA: Diagnosis present

## 2017-06-29 DIAGNOSIS — E1165 Type 2 diabetes mellitus with hyperglycemia: Secondary | ICD-10-CM | POA: Diagnosis not present

## 2017-06-29 DIAGNOSIS — D530 Protein deficiency anemia: Secondary | ICD-10-CM

## 2017-06-29 DIAGNOSIS — J9601 Acute respiratory failure with hypoxia: Secondary | ICD-10-CM | POA: Diagnosis not present

## 2017-06-29 DIAGNOSIS — E872 Acidosis: Secondary | ICD-10-CM | POA: Diagnosis present

## 2017-06-29 DIAGNOSIS — Z4659 Encounter for fitting and adjustment of other gastrointestinal appliance and device: Secondary | ICD-10-CM

## 2017-06-29 DIAGNOSIS — D444 Neoplasm of uncertain behavior of craniopharyngeal duct: Secondary | ICD-10-CM | POA: Diagnosis present

## 2017-06-29 DIAGNOSIS — E230 Hypopituitarism: Secondary | ICD-10-CM | POA: Diagnosis present

## 2017-06-29 DIAGNOSIS — A419 Sepsis, unspecified organism: Secondary | ICD-10-CM

## 2017-06-29 DIAGNOSIS — G9389 Other specified disorders of brain: Secondary | ICD-10-CM | POA: Diagnosis present

## 2017-06-29 DIAGNOSIS — Z7901 Long term (current) use of anticoagulants: Secondary | ICD-10-CM

## 2017-06-29 DIAGNOSIS — Z66 Do not resuscitate: Secondary | ICD-10-CM | POA: Diagnosis not present

## 2017-06-29 DIAGNOSIS — G40919 Epilepsy, unspecified, intractable, without status epilepticus: Secondary | ICD-10-CM | POA: Diagnosis present

## 2017-06-29 DIAGNOSIS — E8770 Fluid overload, unspecified: Secondary | ICD-10-CM | POA: Diagnosis not present

## 2017-06-29 DIAGNOSIS — I469 Cardiac arrest, cause unspecified: Secondary | ICD-10-CM | POA: Diagnosis not present

## 2017-06-29 DIAGNOSIS — Z87891 Personal history of nicotine dependence: Secondary | ICD-10-CM

## 2017-06-29 DIAGNOSIS — G4733 Obstructive sleep apnea (adult) (pediatric): Secondary | ICD-10-CM | POA: Diagnosis present

## 2017-06-29 DIAGNOSIS — N179 Acute kidney failure, unspecified: Secondary | ICD-10-CM

## 2017-06-29 DIAGNOSIS — N390 Urinary tract infection, site not specified: Secondary | ICD-10-CM | POA: Diagnosis present

## 2017-06-29 DIAGNOSIS — J189 Pneumonia, unspecified organism: Secondary | ICD-10-CM | POA: Diagnosis present

## 2017-06-29 DIAGNOSIS — E876 Hypokalemia: Secondary | ICD-10-CM | POA: Diagnosis not present

## 2017-06-29 DIAGNOSIS — T8579XA Infection and inflammatory reaction due to other internal prosthetic devices, implants and grafts, initial encounter: Secondary | ICD-10-CM

## 2017-06-29 DIAGNOSIS — Z86718 Personal history of other venous thrombosis and embolism: Secondary | ICD-10-CM

## 2017-06-29 DIAGNOSIS — K219 Gastro-esophageal reflux disease without esophagitis: Secondary | ICD-10-CM | POA: Diagnosis present

## 2017-06-29 DIAGNOSIS — F79 Unspecified intellectual disabilities: Secondary | ICD-10-CM | POA: Diagnosis present

## 2017-06-29 DIAGNOSIS — Z515 Encounter for palliative care: Secondary | ICD-10-CM | POA: Diagnosis not present

## 2017-06-29 DIAGNOSIS — G9340 Encephalopathy, unspecified: Secondary | ICD-10-CM | POA: Diagnosis present

## 2017-06-29 DIAGNOSIS — E039 Hypothyroidism, unspecified: Secondary | ICD-10-CM | POA: Diagnosis present

## 2017-06-29 DIAGNOSIS — Y95 Nosocomial condition: Secondary | ICD-10-CM | POA: Diagnosis present

## 2017-06-29 DIAGNOSIS — Z982 Presence of cerebrospinal fluid drainage device: Secondary | ICD-10-CM

## 2017-06-29 DIAGNOSIS — F419 Anxiety disorder, unspecified: Secondary | ICD-10-CM | POA: Diagnosis present

## 2017-06-29 DIAGNOSIS — E46 Unspecified protein-calorie malnutrition: Secondary | ICD-10-CM | POA: Diagnosis not present

## 2017-06-29 DIAGNOSIS — Z6841 Body Mass Index (BMI) 40.0 and over, adult: Secondary | ICD-10-CM

## 2017-06-29 DIAGNOSIS — E8809 Other disorders of plasma-protein metabolism, not elsewhere classified: Secondary | ICD-10-CM

## 2017-06-29 DIAGNOSIS — Z794 Long term (current) use of insulin: Secondary | ICD-10-CM

## 2017-06-29 DIAGNOSIS — Z87440 Personal history of urinary (tract) infections: Secondary | ICD-10-CM

## 2017-06-29 HISTORY — DX: Type 2 diabetes mellitus without complications: E11.9

## 2017-06-29 HISTORY — DX: Gastro-esophageal reflux disease without esophagitis: K21.9

## 2017-06-29 HISTORY — DX: Acute embolism and thrombosis of unspecified vein: I82.90

## 2017-06-29 NOTE — ED Provider Notes (Signed)
[facility] DEPT Provider Note: Georgena Spurling, MD, FACEP  CSN: [id number] MRN: [id number] ARRIVAL: 06/26/2017 at 2333 ROOM: WA24/WA24   CHIEF COMPLAINT  Altered Mental Status  Level 5 caveat: Altered mental status; mental retardation HISTORY OF PRESENT ILLNESS  06/30/2017 11:55 PM Margaret Morgan is a 36 y.o. female with mental retardation who was sent from her nursing home, where she has resided for about 2 weeks, for reported altered mental status.  The patient is moaning and nonverbal.  EMS reported that the patient is at her baseline mental status so it is unclear what the change in mental status is.  On arrival she was noted to be hypotensive with a blood pressure of 80/70.  Reportedly a chest x-ray this morning was "concerning" for CHF.  Patient has a history of recurrent urinary tract infections.   Past Medical History:  Diagnosis Date  . Anxiety   . Brain neoplasm ([place])   . Diabetes mellitus without complication ([place])   . GERD (gastroesophageal reflux disease)   . Hypothyroid   . Mental retardation   . 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 Seizures ([place])   . Venous thromboembolism     Past Surgical History:  Procedure Laterality Date  . SHUNT REPLACEMENT     Brain    No family history on file.  Social History   Tobacco Use  . Smoking status: Never Smoker  Substance Use Topics  . Alcohol use: No  . Drug use: Not on file    Prior to Admission medications   Medication Sig Start Date End Date Taking? Authorizing Provider  Calcium Carb-Cholecalciferol (CALCIUM 600 + D) 600-200 MG-UNIT TABS Take 1 tablet by mouth daily.   Yes [provider]  desmopressin (DDAVP) 0.1 MG tablet Take 0.1 mg by mouth daily.   Yes [provider]  divalproex (DEPAKOTE) 500 MG DR tablet Take 500 mg 2 (two) times daily by mouth.   Yes [provider]  HYDROcodone-acetaminophen (NORCO/VICODIN) 5-325 MG tablet Take 1 tablet every 6 (six) hours as needed by mouth for  moderate pain.   Yes [provider]  hydrocortisone (CORTEF) 10 MG tablet Take 2.5 tablets (25 mg total) by mouth 2 (two) times daily. 02/07/15  Yes Ahmed, Chesley Mires, MD  insulin aspart (NOVOLOG FLEXPEN) 100 UNIT/ML FlexPen Inject 2-10 Units 3 (three) times daily with meals into the skin. Per sliding scale 151-200= 2 units 201-250= 4 units 251-300= 6 units 301-350=8 units 351-400= 10 units; Notify MD if blood sugar over 400   Yes [provider]  insulin detemir (LEVEMIR) 100 UNIT/ML injection Inject 16 Units at bedtime into the skin.   Yes [provider]  ipratropium-albuterol (DUONEB) 0.5-2.5 (3) MG/3ML SOLN Take 3 mLs every 6 (six) hours as needed by nebulization (SOB, wheezing).   Yes [provider]  lactulose (CHRONULAC) 10 GM/15ML solution Take 15 mLs (10 g total) by mouth 2 (two) times daily. Patient taking differently: Take 20 g every other day by mouth.  02/07/15  Yes Ahmed, Chesley Mires, MD  levETIRAcetam (KEPPRA) 250 MG tablet Take 1 tablet (250 mg total) by mouth 2 (two) times daily. 02/07/15  Yes Ahmed, Chesley Mires, MD  levothyroxine (SYNTHROID, LEVOTHROID) 100 MCG tablet Take 1 tablet (100 mcg total) by mouth daily before breakfast. 02/07/15  Yes Ahmed, Chesley Mires, MD  nystatin (NYSTATIN) powder Apply 1 g 2 (two) times daily topically.   Yes [provider]  Omega-3 Fatty Acids (FISH OIL) 1000 MG CAPS Take 1,000 mg daily by  mouth.    Yes [provider]  pantoprazole (PROTONIX) 40 MG tablet Take 20 mg daily by mouth.    Yes [provider]  PARoxetine (PAXIL) 20 MG tablet Take 10 mg daily by mouth.    Yes [provider]  potassium chloride SA (K-DUR,KLOR-CON) 20 MEQ tablet Take 20 mEq daily by mouth.   Yes [provider]  QUEtiapine (SEROQUEL) 25 MG tablet Take 25 mg at bedtime by mouth.   Yes [provider]  rivaroxaban (XARELTO) 20 MG TABS tablet Take 20 mg daily by mouth.   Yes [provider]    Zinc Oxide 6 % CREA Apply 1 application 2 (two) times daily topically.   Yes [provider]  phenytoin (DILANTIN) 100 MG ER capsule Take 1 capsule (100 mg total) by mouth daily. Patient not taking: Reported on 06/30/2017 02/07/15   Dellia Nims, MD    Allergies Patient has no known allergies.   REVIEW OF SYSTEMS     PHYSICAL EXAMINATION  Initial Vital Signs Blood pressure (!) 80/70, pulse 86, temperature (!) 97.3 F (36.3 C), resp. rate 20, SpO2 92 %.  Examination General: Well-developed, morbidly obese female in no acute distress; appearance consistent with age of record HENT: Cushingoid facies; atraumatic; dry mucous membranes with crusting of lips Eyes: pupils equal, round and reactive to light; extraocular muscles cannot be assessed Neck: supple Heart: regular rate and rhythm Lungs: clear to auscultation bilaterally Abdomen: soft; obese; apparently nontender; bowel sounds present Extremities: No bony deformity; pulses faint; 3+ pitting edema of lower extremities Neurologic: Awake, moaning, nonverbal; noted to move both upper extremities but without apparent purpose Skin: Warm and dry   RESULTS  Summary of this visit's results, reviewed by myself:   EKG Interpretation  Date/Time:  Monday June 30 2017 00:18:21 EST Ventricular Rate:  87 PR Interval:    QRS Duration: 87 QT Interval:  385 QTC Calculation: 464 R Axis:   75 Text Interpretation:  Sinus rhythm Low voltage, precordial leads Nonspecific repol abnormality, diffuse leads No previous ECGs available Confirmed by Shanon Rosser [phone]) on 06/30/2017 12:20:58 AM      Laboratory Studies: Results for orders placed or performed during the hospital encounter of 07/10/2017 (from the past 24 hour(s))  Pregnancy, urine     Status: None   Collection Time: 07/03/2017 11:33 PM  Result Value Ref Range   Preg Test, Ur NEGATIVE NEGATIVE  Urinalysis, Routine w reflex microscopic     Status: Abnormal   Collection  Time: 06/30/17 12:08 AM  Result Value Ref Range   Color, Urine AMBER (A) YELLOW   APPearance CLOUDY (A) CLEAR   Specific Gravity, Urine 1.014 1.005 - 1.030   pH 5.0 5.0 - 8.0   Glucose, UA NEGATIVE NEGATIVE mg/dL   Hgb urine dipstick SMALL (A) NEGATIVE   Bilirubin Urine NEGATIVE NEGATIVE   Ketones, ur NEGATIVE NEGATIVE mg/dL   Protein, ur 100 (A) NEGATIVE mg/dL   Nitrite NEGATIVE NEGATIVE   Leukocytes, UA LARGE (A) NEGATIVE   RBC / HPF 6-30 0 - 5 RBC/hpf   WBC, UA TOO NUMEROUS TO COUNT 0 - 5 WBC/hpf   Bacteria, UA MANY (A) NONE SEEN   Squamous Epithelial / LPF NONE SEEN NONE SEEN   WBC Clumps PRESENT    Mucus PRESENT    Hyaline Casts, UA PRESENT   Comprehensive metabolic panel     Status: Abnormal   Collection Time: 06/30/17 12:13 AM  Result Value Ref Range   Sodium  135 135 - 145 mmol/L   Potassium 3.5 3.5 - 5.1 mmol/L   Chloride 103 101 - 111 mmol/L   CO2 18 (L) 22 - 32 mmol/L   Glucose, Bld 159 (H) 65 - 99 mg/dL   BUN <5 (L) 6 - 20 mg/dL   Creatinine, Ser 3.17 (H) 0.44 - 1.00 mg/dL   Calcium 8.8 (L) 8.9 - 10.3 mg/dL   Total Protein 5.0 (L) 6.5 - 8.1 g/dL   Albumin 2.1 (L) 3.5 - 5.0 g/dL   AST 31 15 - 41 U/L   ALT 25 14 - 54 U/L   Alkaline Phosphatase 68 38 - 126 U/L   Total Bilirubin 0.9 0.3 - 1.2 mg/dL   GFR calc non Af Amer 18 (L) >60 mL/min   GFR calc Af Amer 20 (L) >60 mL/min   Anion gap 14 5 - 15  CBC WITH DIFFERENTIAL     Status: Abnormal   Collection Time: 06/30/17 12:13 AM  Result Value Ref Range   WBC 12.4 (H) 4.0 - 10.5 K/uL   RBC 2.93 (L) 3.87 - 5.11 MIL/uL   Hemoglobin 8.4 (L) 12.0 - 15.0 g/dL   HCT 27.7 (L) 36.0 - 46.0 %   MCV 94.5 78.0 - 100.0 fL   MCH 28.7 26.0 - 34.0 pg   MCHC 30.3 30.0 - 36.0 g/dL   RDW 19.1 (H) 11.5 - 15.5 %   Platelets 245 150 - 400 K/uL   Neutrophils Relative % 60 %   Neutro Abs 7.4 1.7 - 7.7 K/uL   Lymphocytes Relative 26 %   Lymphs Abs 3.2 0.7 - 4.0 K/uL   Monocytes Relative 10 %   Monocytes Absolute 1.3 (H) 0.1 - 1.0  K/uL   Eosinophils Relative 4 %   Eosinophils Absolute 0.5 0.0 - 0.7 K/uL   Basophils Relative 0 %   Basophils Absolute 0.1 0.0 - 0.1 K/uL  Phenytoin level, total     Status: Abnormal   Collection Time: 06/30/17 12:13 AM  Result Value Ref Range   Phenytoin Lvl <2.5 (L) 10.0 - 20.0 ug/mL  Valproic acid level     Status: None   Collection Time: 06/30/17 12:13 AM  Result Value Ref Range   Valproic Acid Lvl 90 50.0 - 100.0 ug/mL  I-Stat CG4 Lactic Acid, ED  (not at  [facility])     Status: Abnormal   Collection Time: 06/30/17 12:23 AM  Result Value Ref Range   Lactic Acid, Venous 4.61 (HH) 0.5 - 1.9 mmol/L   Comment NOTIFIED PHYSICIAN    Imaging Studies: Dg Chest Port 1 View  Result Date: 06/30/2017 CLINICAL DATA:  Altered mental status.  Possible sepsis. EXAM: PORTABLE CHEST 1 VIEW COMPARISON:  None. FINDINGS: Low inspiratory examination with crowded vascular markings. Cardiac silhouette is mildly enlarged. RIGHT perihilar airspace opacity. No pleural effusion. No pneumothorax. Large body habitus. Osseous structures are nonacute. IMPRESSION: RIGHT perihilar consolidation concerning for pneumonia. Followup PA and lateral chest X-ray is recommended in 3-4 weeks following trial of antibiotic therapy to ensure resolution and exclude underlying malignancy. Mild cardiomegaly. Electronically Signed   By: Elon Alas M.D.   On: 06/30/2017 00:53    ED COURSE  Nursing notes and initial vitals signs, including pulse oximetry, reviewed.  Vitals:   06/30/17 0120 06/30/17 0140 06/30/17 0145 06/30/17 0146  BP: (!) 65/43 98/65 90/72  90/72  Pulse:  89 90 89  Resp:    (!) 25  Temp:      TempSrc:  SpO2:  91% 94% 96%  Weight:      Height:       Code sepsis called after initial evaluation.  2 IVs were started, blood cultures and other laboratory studies were obtained and she was started on Rocephin for obviously infected urine.  Zithromax was added for possible pneumonia.  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  2:10 AM Phenylephrine drip ordered for persistent hypotension.  Patient to be admitted to the ICU by the pulmonary critical care service.  PROCEDURES   CRITICAL CARE Performed by: Shanon Rosser L Total critical care time: 60 minutes Critical care time was exclusive of separately billable procedures and treating other patients. Critical care was necessary to treat or prevent imminent or life-threatening deterioration. Critical care was time spent personally by me on the following activities: development of treatment plan with patient and/or surrogate as well as nursing, discussions with consultants, evaluation of patient's response to treatment, examination of patient, obtaining history from patient or surrogate, ordering and performing treatments and interventions, ordering and review of laboratory studies, ordering and review of radiographic studies, pulse oximetry and re-evaluation of patient's condition.   ED DIAGNOSES     ICD-10-CM   1. Sepsis due to urinary tract infection (HCC) A41.9    N39.0   2. Anemia due to protein deficiency D53.0   3. AKI (acute kidney injury) ([place]) N17.9   4. Hypoalbuminemia E88.09        Osbaldo Mark, Jenny Reichmann, MD 06/30/17 [phone]

## 2017-06-29 NOTE — ED Triage Notes (Signed)
Brought in by EMS from SNF c/o of altered mental status. Per EMS staff stated Pt had CXR this morning concerning CHF and pt had history of recurrent UTI. No signs of distress at this time. Pt has history of MR.

## 2017-06-29 NOTE — ED Notes (Signed)
Bed: TZ00 Expected date:  Expected time:  Means of arrival:  Comments: 36 yo F/SNF

## 2017-06-30 ENCOUNTER — Other Ambulatory Visit: Payer: Self-pay

## 2017-06-30 ENCOUNTER — Inpatient Hospital Stay (HOSPITAL_COMMUNITY): Payer: Medicare Other

## 2017-06-30 ENCOUNTER — Emergency Department (HOSPITAL_COMMUNITY): Payer: Medicare Other

## 2017-06-30 ENCOUNTER — Encounter (HOSPITAL_COMMUNITY): Payer: Self-pay | Admitting: Emergency Medicine

## 2017-06-30 DIAGNOSIS — D530 Protein deficiency anemia: Secondary | ICD-10-CM

## 2017-06-30 DIAGNOSIS — R579 Shock, unspecified: Secondary | ICD-10-CM | POA: Diagnosis not present

## 2017-06-30 DIAGNOSIS — Z6841 Body Mass Index (BMI) 40.0 and over, adult: Secondary | ICD-10-CM | POA: Diagnosis not present

## 2017-06-30 DIAGNOSIS — E039 Hypothyroidism, unspecified: Secondary | ICD-10-CM | POA: Diagnosis present

## 2017-06-30 DIAGNOSIS — G4733 Obstructive sleep apnea (adult) (pediatric): Secondary | ICD-10-CM | POA: Diagnosis present

## 2017-06-30 DIAGNOSIS — N179 Acute kidney failure, unspecified: Secondary | ICD-10-CM

## 2017-06-30 DIAGNOSIS — J9601 Acute respiratory failure with hypoxia: Secondary | ICD-10-CM | POA: Diagnosis not present

## 2017-06-30 DIAGNOSIS — A419 Sepsis, unspecified organism: Secondary | ICD-10-CM | POA: Diagnosis present

## 2017-06-30 DIAGNOSIS — E8770 Fluid overload, unspecified: Secondary | ICD-10-CM | POA: Diagnosis not present

## 2017-06-30 DIAGNOSIS — D444 Neoplasm of uncertain behavior of craniopharyngeal duct: Secondary | ICD-10-CM | POA: Diagnosis present

## 2017-06-30 DIAGNOSIS — E8809 Other disorders of plasma-protein metabolism, not elsewhere classified: Secondary | ICD-10-CM | POA: Diagnosis not present

## 2017-06-30 DIAGNOSIS — N390 Urinary tract infection, site not specified: Secondary | ICD-10-CM | POA: Diagnosis present

## 2017-06-30 DIAGNOSIS — I5031 Acute diastolic (congestive) heart failure: Secondary | ICD-10-CM

## 2017-06-30 DIAGNOSIS — Z66 Do not resuscitate: Secondary | ICD-10-CM | POA: Diagnosis not present

## 2017-06-30 DIAGNOSIS — E230 Hypopituitarism: Secondary | ICD-10-CM | POA: Diagnosis present

## 2017-06-30 DIAGNOSIS — Z515 Encounter for palliative care: Secondary | ICD-10-CM | POA: Diagnosis not present

## 2017-06-30 DIAGNOSIS — G9340 Encephalopathy, unspecified: Secondary | ICD-10-CM | POA: Diagnosis present

## 2017-06-30 DIAGNOSIS — K219 Gastro-esophageal reflux disease without esophagitis: Secondary | ICD-10-CM | POA: Diagnosis present

## 2017-06-30 DIAGNOSIS — J189 Pneumonia, unspecified organism: Secondary | ICD-10-CM | POA: Diagnosis present

## 2017-06-30 DIAGNOSIS — E876 Hypokalemia: Secondary | ICD-10-CM | POA: Diagnosis not present

## 2017-06-30 DIAGNOSIS — E872 Acidosis: Secondary | ICD-10-CM | POA: Diagnosis present

## 2017-06-30 DIAGNOSIS — G40919 Epilepsy, unspecified, intractable, without status epilepticus: Secondary | ICD-10-CM | POA: Diagnosis present

## 2017-06-30 DIAGNOSIS — Y95 Nosocomial condition: Secondary | ICD-10-CM | POA: Diagnosis present

## 2017-06-30 DIAGNOSIS — E46 Unspecified protein-calorie malnutrition: Secondary | ICD-10-CM | POA: Diagnosis not present

## 2017-06-30 DIAGNOSIS — F419 Anxiety disorder, unspecified: Secondary | ICD-10-CM | POA: Diagnosis present

## 2017-06-30 DIAGNOSIS — R6521 Severe sepsis with septic shock: Secondary | ICD-10-CM | POA: Diagnosis present

## 2017-06-30 DIAGNOSIS — F79 Unspecified intellectual disabilities: Secondary | ICD-10-CM | POA: Diagnosis present

## 2017-06-30 DIAGNOSIS — E1165 Type 2 diabetes mellitus with hyperglycemia: Secondary | ICD-10-CM | POA: Diagnosis not present

## 2017-06-30 LAB — URINALYSIS, ROUTINE W REFLEX MICROSCOPIC
Bilirubin Urine: NEGATIVE
Glucose, UA: NEGATIVE mg/dL
KETONES UR: NEGATIVE mg/dL
Nitrite: NEGATIVE
PROTEIN: 100 mg/dL — AB
SQUAMOUS EPITHELIAL / LPF: NONE SEEN
Specific Gravity, Urine: 1.014 (ref 1.005–1.030)
pH: 5 (ref 5.0–8.0)

## 2017-06-30 LAB — COMPREHENSIVE METABOLIC PANEL
ALBUMIN: 2.1 g/dL — AB (ref 3.5–5.0)
ALK PHOS: 82 U/L (ref 38–126)
ALT: 25 U/L (ref 14–54)
ALT: 28 U/L (ref 14–54)
ANION GAP: 14 (ref 5–15)
ANION GAP: 14 (ref 5–15)
AST: 31 U/L (ref 15–41)
AST: 38 U/L (ref 15–41)
Albumin: 2 g/dL — ABNORMAL LOW (ref 3.5–5.0)
Alkaline Phosphatase: 68 U/L (ref 38–126)
BILIRUBIN TOTAL: 0.9 mg/dL (ref 0.3–1.2)
BUN: <5 mg/dL — ABNORMAL LOW (ref 6–20)
CALCIUM: 7.7 mg/dL — AB (ref 8.9–10.3)
CHLORIDE: 103 mmol/L (ref 101–111)
CO2: 18 mmol/L — ABNORMAL LOW (ref 22–32)
CO2: 15 mmol/L — AB (ref 22–32)
Calcium: 8.8 mg/dL — ABNORMAL LOW (ref 8.9–10.3)
Chloride: 106 mmol/L (ref 101–111)
Creatinine, Ser: 3.17 mg/dL — ABNORMAL HIGH (ref 0.44–1.00)
Creatinine, Ser: 2.62 mg/dL — ABNORMAL HIGH (ref 0.44–1.00)
GFR calc Af Amer: 20 mL/min — ABNORMAL LOW (ref >60)
GFR calc non Af Amer: 18 mL/min — ABNORMAL LOW (ref >60)
GFR calc non Af Amer: 22 mL/min — ABNORMAL LOW (ref >60)
GFR, EST AFRICAN AMERICAN: 26 mL/min — AB (ref >60)
GLUCOSE: 159 mg/dL — AB (ref 65–99)
Glucose, Bld: 255 mg/dL — ABNORMAL HIGH (ref 65–99)
POTASSIUM: 3.5 mmol/L (ref 3.5–5.1)
POTASSIUM: 4 mmol/L (ref 3.5–5.1)
SODIUM: 135 mmol/L (ref 135–145)
Sodium: 135 mmol/L (ref 135–145)
TOTAL PROTEIN: 5 g/dL — AB (ref 6.5–8.1)
TOTAL PROTEIN: 5.4 g/dL — AB (ref 6.5–8.1)
Total Bilirubin: 1.2 mg/dL (ref 0.3–1.2)

## 2017-06-30 LAB — I-STAT CG4 LACTIC ACID, ED: Lactic Acid, Venous: 4.61 mmol/L (ref 0.5–1.9)

## 2017-06-30 LAB — CBC WITH DIFFERENTIAL/PLATELET
BASOS ABS: 0.1 10*3/uL (ref 0.0–0.1)
Basophils Relative: 0 %
EOS PCT: 4 %
Eosinophils Absolute: 0.5 10*3/uL (ref 0.0–0.7)
HEMATOCRIT: 27.7 % — AB (ref 36.0–46.0)
Hemoglobin: 8.4 g/dL — ABNORMAL LOW (ref 12.0–15.0)
LYMPHS ABS: 3.2 10*3/uL (ref 0.7–4.0)
LYMPHS PCT: 26 %
MCH: 28.7 pg (ref 26.0–34.0)
MCHC: 30.3 g/dL (ref 30.0–36.0)
MCV: 94.5 fL (ref 78.0–100.0)
MONO ABS: 1.3 10*3/uL — AB (ref 0.1–1.0)
Monocytes Relative: 10 %
NEUTROS ABS: 7.4 10*3/uL (ref 1.7–7.7)
Neutrophils Relative %: 60 %
PLATELETS: 245 10*3/uL (ref 150–400)
RBC: 2.93 MIL/uL — AB (ref 3.87–5.11)
RDW: 19.1 % — ABNORMAL HIGH (ref 11.5–15.5)
WBC: 12.4 10*3/uL — AB (ref 4.0–10.5)

## 2017-06-30 LAB — GLUCOSE, CAPILLARY
GLUCOSE-CAPILLARY: 215 mg/dL — AB (ref 65–99)
GLUCOSE-CAPILLARY: 239 mg/dL — AB (ref 65–99)
Glucose-Capillary: 166 mg/dL — ABNORMAL HIGH (ref 65–99)
Glucose-Capillary: 180 mg/dL — ABNORMAL HIGH (ref 65–99)
Glucose-Capillary: 257 mg/dL — ABNORMAL HIGH (ref 65–99)
Glucose-Capillary: 269 mg/dL — ABNORMAL HIGH (ref 65–99)

## 2017-06-30 LAB — BLOOD GAS, ARTERIAL
ACID-BASE DEFICIT: 11.2 mmol/L — AB (ref 0.0–2.0)
ACID-BASE DEFICIT: 12.2 mmol/L — AB (ref 0.0–2.0)
BICARBONATE: 15.7 mmol/L — AB (ref 20.0–28.0)
BICARBONATE: 13.7 mmol/L — AB (ref 20.0–28.0)
DRAWN BY: 514251
DRAWN BY: 331471
O2 CONTENT: 2.5 L/min
O2 Content: 2 L/min
O2 SAT: 95.1 %
O2 Saturation: 89.4 %
PATIENT TEMPERATURE: 96.8
PATIENT TEMPERATURE: 98.6
pCO2 arterial: 39.9 mmHg (ref 32.0–48.0)
pCO2 arterial: 32.7 mmHg (ref 32.0–48.0)
pH, Arterial: 7.213 — ABNORMAL LOW (ref 7.350–7.450)
pH, Arterial: 7.245 — ABNORMAL LOW (ref 7.350–7.450)
pO2, Arterial: 85.7 mmHg (ref 83.0–108.0)
pO2, Arterial: 64.3 mmHg — ABNORMAL LOW (ref 83.0–108.0)

## 2017-06-30 LAB — VALPROIC ACID LEVEL: VALPROIC ACID LVL: 90 ug/mL (ref 50.0–100.0)

## 2017-06-30 LAB — ECHOCARDIOGRAM COMPLETE
HEIGHTINCHES: 62 in
Weight: 5569.7 oz

## 2017-06-30 LAB — HEPARIN ANTI-XA: HEPARIN LMW: 1.94 [IU]/mL

## 2017-06-30 LAB — BRAIN NATRIURETIC PEPTIDE: B NATRIURETIC PEPTIDE 5: 64.6 pg/mL (ref 0.0–100.0)

## 2017-06-30 LAB — T4, FREE: Free T4: 0.77 ng/dL (ref 0.61–1.12)

## 2017-06-30 LAB — PHENYTOIN LEVEL, TOTAL

## 2017-06-30 LAB — AMMONIA: AMMONIA: 92 umol/L — AB (ref 9–35)

## 2017-06-30 LAB — LACTIC ACID, PLASMA: LACTIC ACID, VENOUS: 4.7 mmol/L — AB (ref 0.5–1.9)

## 2017-06-30 LAB — PROCALCITONIN: PROCALCITONIN: 0.67 ng/mL

## 2017-06-30 LAB — TSH: TSH: 1.12 u[IU]/mL (ref 0.350–4.500)

## 2017-06-30 LAB — MRSA PCR SCREENING: MRSA by PCR: NEGATIVE

## 2017-06-30 LAB — PREGNANCY, URINE: PREG TEST UR: NEGATIVE

## 2017-06-30 MED ORDER — VALPROATE SODIUM 500 MG/5ML IV SOLN
500.0000 mg | Freq: Two times a day (BID) | INTRAVENOUS | Status: DC
  Administered 2017-06-30 – 2017-07-01 (×3): 500 mg via INTRAVENOUS
  Filled 2017-06-30 (×4): qty 5

## 2017-06-30 MED ORDER — PANTOPRAZOLE SODIUM 40 MG IV SOLR
40.0000 mg | Freq: Once | INTRAVENOUS | Status: AC
  Administered 2017-06-30: 40 mg via INTRAVENOUS
  Filled 2017-06-30: qty 40

## 2017-06-30 MED ORDER — CHLORHEXIDINE GLUCONATE 0.12 % MT SOLN
15.0000 mL | Freq: Two times a day (BID) | OROMUCOSAL | Status: DC
  Administered 2017-06-30 – 2017-07-01 (×2): 15 mL via OROMUCOSAL
  Filled 2017-06-30: qty 15

## 2017-06-30 MED ORDER — DEXTROSE 5 % IV SOLN
1.0000 g | Freq: Once | INTRAVENOUS | Status: AC
  Administered 2017-06-30: 1 g via INTRAVENOUS
  Filled 2017-06-30: qty 10

## 2017-06-30 MED ORDER — NOREPINEPHRINE BITARTRATE 1 MG/ML IV SOLN
0.0000 ug/min | INTRAVENOUS | Status: DC
  Administered 2017-06-30: 30 ug/min via INTRAVENOUS
  Administered 2017-06-30: 20 ug/min via INTRAVENOUS
  Administered 2017-07-01: 30 ug/min via INTRAVENOUS
  Filled 2017-06-30 (×3): qty 16

## 2017-06-30 MED ORDER — PERFLUTREN LIPID MICROSPHERE
1.0000 mL | INTRAVENOUS | Status: AC | PRN
  Administered 2017-06-30: 2 mL via INTRAVENOUS
  Filled 2017-06-30: qty 10

## 2017-06-30 MED ORDER — DEXTROSE 5 % IV SOLN
2.0000 g | INTRAVENOUS | Status: DC
  Administered 2017-06-30 – 2017-07-01 (×2): 2 g via INTRAVENOUS
  Filled 2017-06-30 (×2): qty 2

## 2017-06-30 MED ORDER — SODIUM CHLORIDE 0.9 % IV BOLUS (SEPSIS)
500.0000 mL | Freq: Once | INTRAVENOUS | Status: AC
  Administered 2017-06-30: 500 mL via INTRAVENOUS

## 2017-06-30 MED ORDER — LEVOTHYROXINE SODIUM 100 MCG IV SOLR
50.0000 ug | Freq: Every day | INTRAVENOUS | Status: DC
  Administered 2017-06-30 – 2017-07-01 (×2): 50 ug via INTRAVENOUS
  Filled 2017-06-30 (×2): qty 5

## 2017-06-30 MED ORDER — SODIUM CHLORIDE 0.9 % IV BOLUS (SEPSIS)
1000.0000 mL | Freq: Once | INTRAVENOUS | Status: AC
  Administered 2017-06-30: 1000 mL via INTRAVENOUS

## 2017-06-30 MED ORDER — PHENYLEPHRINE HCL 10 MG/ML IJ SOLN
0.0000 ug/min | Freq: Once | INTRAVENOUS | Status: DC
  Filled 2017-06-30: qty 1

## 2017-06-30 MED ORDER — HYDROCORTISONE NA SUCCINATE PF 100 MG IJ SOLR
100.0000 mg | Freq: Three times a day (TID) | INTRAMUSCULAR | Status: DC
  Administered 2017-06-30 – 2017-07-01 (×5): 100 mg via INTRAVENOUS
  Filled 2017-06-30 (×6): qty 2

## 2017-06-30 MED ORDER — HEPARIN SODIUM (PORCINE) 5000 UNIT/ML IJ SOLN
5000.0000 [IU] | Freq: Three times a day (TID) | INTRAMUSCULAR | Status: DC
  Administered 2017-06-30 – 2017-07-01 (×5): 5000 [IU] via SUBCUTANEOUS
  Filled 2017-06-30 (×5): qty 1

## 2017-06-30 MED ORDER — SODIUM CHLORIDE 0.9 % IV SOLN
INTRAVENOUS | Status: DC
  Administered 2017-06-30 – 2017-07-01 (×3): via INTRAVENOUS

## 2017-06-30 MED ORDER — DEXTROSE 5 % IV SOLN
500.0000 mg | Freq: Once | INTRAVENOUS | Status: AC
  Administered 2017-06-30: 500 mg via INTRAVENOUS
  Filled 2017-06-30: qty 500

## 2017-06-30 MED ORDER — SODIUM CHLORIDE 0.9 % IV SOLN: 250.0000 mL | INTRAVENOUS | Status: DC | PRN

## 2017-06-30 MED ORDER — PERFLUTREN LIPID MICROSPHERE
INTRAVENOUS | Status: AC
  Filled 2017-06-30: qty 10

## 2017-06-30 MED ORDER — PANTOPRAZOLE SODIUM 40 MG IV SOLR
40.0000 mg | INTRAVENOUS | Status: DC
  Administered 2017-07-01: 40 mg via INTRAVENOUS
  Filled 2017-06-30: qty 40

## 2017-06-30 MED ORDER — DEXTROSE 5 % IV SOLN
500.0000 mg | INTRAVENOUS | Status: DC
  Administered 2017-06-30: 500 mg via INTRAVENOUS
  Filled 2017-06-30: qty 500

## 2017-06-30 MED ORDER — PHENYLEPHRINE HCL-NACL 10-0.9 MG/250ML-% IV SOLN
INTRAVENOUS | Status: AC
  Administered 2017-06-30: 140 ug/min
  Filled 2017-06-30: qty 250

## 2017-06-30 MED ORDER — INSULIN ASPART 100 UNIT/ML ~~LOC~~ SOLN
2.0000 [IU] | SUBCUTANEOUS | Status: DC
  Administered 2017-06-30: 6 [IU] via SUBCUTANEOUS
  Administered 2017-06-30: 4 [IU] via SUBCUTANEOUS
  Administered 2017-06-30 (×2): 6 [IU] via SUBCUTANEOUS
  Administered 2017-06-30: 4 [IU] via SUBCUTANEOUS

## 2017-06-30 MED ORDER — DEXTROSE 5 % IV SOLN
0.0000 ug/min | Freq: Once | INTRAVENOUS | Status: AC
  Administered 2017-06-30: 20 ug/min via INTRAVENOUS
  Filled 2017-06-30: qty 1

## 2017-06-30 MED ORDER — DEXTROSE 5 % IV SOLN: 1.0000 g | Freq: Once | INTRAVENOUS | Status: DC

## 2017-06-30 MED ORDER — ORAL CARE MOUTH RINSE
15.0000 mL | Freq: Two times a day (BID) | OROMUCOSAL | Status: DC
  Administered 2017-07-01 (×2): 15 mL via OROMUCOSAL

## 2017-06-30 MED ORDER — DEXTROSE 5 % IV SOLN
0.0000 ug/min | INTRAVENOUS | Status: DC
  Administered 2017-06-30: 32 ug/min via INTRAVENOUS
  Administered 2017-06-30: 2 ug/min via INTRAVENOUS
  Administered 2017-06-30: 22 ug/min via INTRAVENOUS
  Filled 2017-06-30 (×5): qty 4

## 2017-06-30 MED ORDER — SODIUM CHLORIDE 0.9 % IV SOLN
250.0000 mg | Freq: Two times a day (BID) | INTRAVENOUS | Status: DC
  Administered 2017-06-30 – 2017-07-01 (×3): 250 mg via INTRAVENOUS
  Filled 2017-06-30 (×4): qty 2.5

## 2017-06-30 MED ORDER — HYDROCORTISONE NA SUCCINATE PF 100 MG IJ SOLR
100.0000 mg | Freq: Once | INTRAMUSCULAR | Status: AC
  Administered 2017-06-30: 100 mg via INTRAVENOUS
  Filled 2017-06-30: qty 2

## 2017-06-30 NOTE — Care Management Note (Signed)
Case Management Note  Patient Details  Name: Zoii Florer MRN: [id number] Date of Birth: 04-17-1981  Subjective/Objective:    AMS              Iv neo drip for hypotension, iv keppra for seizure/ iv abx  Action/Plan: Date: June 30, 2017 Velva Harman, BSN, [place], [place] Chart and notes review for patient progress and needs. Will follow for case management and discharge needs. Next review date: [id number]  Expected Discharge Date:                  Expected Discharge Plan:  Home/Self Care  In-House Referral:     Discharge planning Services  CM Consult  Post Acute Care Choice:    Choice offered to:     DME Arranged:    DME Agency:     HH Arranged:    HH Agency:     Status of Service:  In process, will continue to follow  If discussed at Long Length of Stay Meetings, dates discussed:    Additional Comments:  Leeroy Cha, RN 06/30/2017, 8:41 AM

## 2017-06-30 NOTE — Procedures (Addendum)
Central Venous Catheter Insertion Procedure Note Margaret Morgan [id number] [birth year]  Procedure: Insertion of Central Venous Catheter Indications: Assessment of intravascular volume, Drug and/or fluid administration and Frequent blood sampling  Procedure Details Consent: Unable to obtain consent because of altered level of consciousness. and also emergent circumstance Time Out: Verified patient identification, verified procedure, site/side was marked, verified correct patient position, special equipment/implants available, medications/allergies/relevent history reviewed, required imaging and test results available.  Performed  Maximum sterile technique was used including antiseptics, cap, gloves, gown, hand hygiene, mask and sheet. Skin prep: Chlorhexidine; local anesthetic administered A antimicrobial bonded/coated triple lumen catheter was placed in the right internal jugular vein using the Seldinger technique.  Evaluation Blood flow good with good return from each lumen Complications: No apparent complications  Patient did tolerate procedure well. Chest X-ray ordered to verify placement.  CXR demonstrates it :RIGHT internal jugular central venous catheter distal tip projects in distal superior vena cava  Margaret Morgan 06/30/2017, 6:07 AM

## 2017-06-30 NOTE — ED Notes (Signed)
Pt placed on 2L via [place] due to SPO2 of 91% on Room Air.

## 2017-06-30 NOTE — Progress Notes (Signed)
Foley insertion attempted at 0330, unsuccessful. MD aware. Will continue to monitor.

## 2017-06-30 NOTE — ED Notes (Signed)
Per intensivist: transfer pt now, no need to take her rectal temperature.

## 2017-06-30 NOTE — Progress Notes (Signed)
.. ..  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 PCCM consulted for admission  SIGNIFICANT EVENTS  Hypotension despite fluids  STUDIES:  Echocardiogram pending   SUBJECTIVE:  Central line repositioned this a.m. VITAL SIGNS: Temp:  [95.1 F (35.1 C)-99.1 F (37.3 C)] 99.1 F (37.3 C) (11/12 1115) Pulse Rate:  [86-114] 113 (11/12 1115) Resp:  [16-33] 23 (11/12 1115) BP: (50-128)/(21-98) 90/35 (11/12 1100) SpO2:  [91 %-99 %] 92 % (11/12 1115) Weight:  [348 lb 1.7 oz (157.9 kg)-350 lb (158.8 kg)] 348 lb 1.7 oz (157.9 kg) (11/12 0310)  Physical exam   General: This is a 36 year old morbidly obese female on a.m. rounds she is in reverse Trendelenburg's position arouses to gentle tactile stimulation yells out, briskly localizes to stimulus HEENT: Atraumatic, dry mucous membranes neck is large pupils are equal and reactive Cardiac: Regular rate and rhythm Pulmonary decreased bases no accessory muscle use Abdomen large, nontender, positive bowel sounds. GU: Concentrated foul-smelling urine Extremities/musculoskeletal good upper extremity strength, generalized anasarca with pitting edema extremities are warm and dry Neuro/psych awake, moves all extremities, nonverbal except for yelling out and screaming from time to time.  Recent Labs  Lab 06/30/17 0013  NA 135  K 3.5  CL 103  CO2 18*  BUN <5*  CREATININE 3.17*  GLUCOSE 159*   Recent Labs  Lab 06/30/17 0013  HGB 8.4*  HCT 27.7*  WBC 12.4*  PLT 245   Dg Chest Port 1 View  Result Date: 06/30/2017 CLINICAL DATA:  Central line placement. EXAM: PORTABLE CHEST 1 VIEW COMPARISON:  Chest radiograph June 30, 2017 at 0016  hours FINDINGS: RIGHT internal jugular central venous catheter distal tip projects in distal superior vena cava. No pneumothorax. Cardiac silhouette is mildly enlarged and unchanged. Pulmonary vascular congestion and perihilar airspace opacities. No pleural effusion. No pneumothorax. Large body habitus. Osseous structures are nonsuspicious. IMPRESSION: RIGHT internal jugular central venous catheter distal tip projects in distal superior vena cava. No pneumothorax. Mild cardiomegaly and pulmonary vascular congestion. Perihilar confluent edema, atelectasis or pneumonia. Electronically Signed   By: Elon Alas M.D.   On: 06/30/2017 05:21   Dg Chest Port 1 View  Result Date: 06/30/2017 CLINICAL DATA:  Altered mental status.  Possible sepsis. EXAM: PORTABLE CHEST 1 VIEW COMPARISON:  None. FINDINGS: Low inspiratory examination with crowded vascular markings. Cardiac silhouette is mildly enlarged. RIGHT perihilar airspace opacity. No pleural effusion. No pneumothorax. Large body habitus. Osseous structures are nonacute. IMPRESSION: RIGHT perihilar consolidation concerning for pneumonia. Followup PA and lateral chest X-ray is recommended in 3-4 weeks following trial of antibiotic therapy to ensure resolution and exclude underlying malignancy. Mild cardiomegaly. Electronically Signed   By: Elon Alas M.D.   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  On exam, the patient is in septic shock from pneumonia and UTI.  On exam, patient is not following commands.  I am concerned about her airway protection.  I reviewed CXR myself, diffuse opacities noted.  Will continue abx.  Continue pressor support.  Need to determine code status.  I am concerned that patient may need to be intubated.  But given her quality of life and severe illness but more importantly her ability to comprehend intubation/trach...etc.  Will contact brother and determine plan of care.  The patient is critically ill with multiple organ systems failure and requires high complexity decision making for assessment and support, frequent evaluation and titration of therapies, application of advanced monitoring technologies and extensive interpretation of multiple databases.   Critical Care Time devoted to patient care services described in this note is  35  Minutes. This time reflects time of care of this signee Dr Jennet Maduro. This critical care time does not reflect procedure time, or teaching time or supervisory time of PA/NP/Med student/Med Resident etc but could  involve care discussion time.  Rush Farmer, M.D. [facility] Pulmonary/Critical Care Medicine. Pager: [phone]. After hours pager: [phone].  06/30/2017, 11:22 AM

## 2017-06-30 NOTE — Progress Notes (Signed)
Spoke w/ brother via phone. Status very marginal. High risk for intubation which I have advised against.  Plan Cont pressors Cont abx High flow if indicated but no BIPAP or vent   Erick Colace ACNP-BC [place] Pager # [phone] OR # [phone] if no answer

## 2017-06-30 NOTE — Progress Notes (Signed)
Attempted to get report. RN tied up with another patient at this time.

## 2017-06-30 NOTE — Progress Notes (Signed)
Pt arrived to ICU/SD with warming blanket on. Temp 96.8 Axillary. RN attempting to place temperature foley to closely monitor pt temperature. Warming blanket remaining on pt, MD aware. Will continue to monitor.

## 2017-06-30 NOTE — Progress Notes (Signed)
  Echocardiogram 2D Echocardiogram has been performed.  Margaret Morgan 06/30/2017, 1:38 PM

## 2017-06-30 NOTE — Progress Notes (Signed)
PHARMACY NOTE -  ANTIBIOTIC RENAL DOSE ADJUSTMENT   Request received for Pharmacy to assist with antibiotic renal dose adjustment.  Patient has been initiated on Ceftriaxone 2gm iv q24hr for UTI. SCr 3.17, estimated CrCl 35 ml/min Current dosage is appropriate and need for further dosage adjustment appears unlikely at present. Will sign off at this time.  Please reconsult if a change in clinical status warrants re-evaluation of dosage.

## 2017-06-30 NOTE — Plan of Care (Signed)
  Progressing Elimination: Will not experience complications related to bowel motility 06/30/2017 0755 - Progressing by Hermelinda Dellen, RN Will not experience complications related to urinary retention 06/30/2017 0755 - Progressing by Hermelinda Dellen, RN

## 2017-06-30 NOTE — H&P (Addendum)
.. ..  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 UA suspicious for source of infection PCCM consulted for admission.  She is verbal and can say simple phrases. Pt has a h/o recurrent UTIs. On arrival her BP was 80/70 and lowest charted was 60 systolic.   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  has a past surgical history that includes Shunt replacement. Prior to Admission medications   Medication Sig Start Date End Date Taking? Authorizing Provider  Calcium Carb-Cholecalciferol (CALCIUM 600 + D) 600-200 MG-UNIT TABS Take 1 tablet by mouth daily.   Yes [provider]  desmopressin (DDAVP) 0.1 MG tablet Take 0.1 mg by mouth daily.   Yes [provider]  divalproex (DEPAKOTE) 500 MG DR tablet Take 500 mg 2 (two) times daily by mouth.   Yes [provider]  HYDROcodone-acetaminophen (NORCO/VICODIN) 5-325 MG tablet Take 1 tablet every 6 (six) hours as needed by mouth for moderate pain.   Yes [provider]  hydrocortisone (CORTEF) 10 MG tablet Take 2.5  tablets (25 mg total) by mouth 2 (two) times daily. 02/07/15  Yes Ahmed, Chesley Mires, MD  insulin aspart (NOVOLOG FLEXPEN) 100 UNIT/ML FlexPen Inject 2-10 Units 3 (three) times daily with meals into the skin. Per sliding scale 151-200= 2 units 201-250= 4 units 251-300= 6 units 301-350=8 units 351-400= 10 units; Notify MD if blood sugar over 400   Yes [provider]  insulin detemir (LEVEMIR) 100 UNIT/ML injection Inject 16 Units at bedtime into the skin.   Yes [provider]  ipratropium-albuterol (DUONEB) 0.5-2.5 (3) MG/3ML SOLN Take 3 mLs every 6 (six) hours as needed by nebulization (SOB, wheezing).   Yes [provider]  lactulose (CHRONULAC) 10 GM/15ML solution Take 15 mLs (10 g total) by mouth 2 (two) times daily. Patient taking differently: Take 20 g every other day by mouth.  02/07/15  Yes Ahmed, Chesley Mires, MD  levETIRAcetam (KEPPRA) 250 MG tablet Take 1 tablet (250 mg total) by mouth 2 (two) times daily. 02/07/15  Yes Ahmed, Chesley Mires, MD  levothyroxine (SYNTHROID, LEVOTHROID) 100 MCG tablet Take 1 tablet (100 mcg total) by mouth daily before breakfast. 02/07/15  Yes Ahmed, Chesley Mires, MD  nystatin (NYSTATIN) powder Apply 1 g 2 (two) times daily topically.   Yes [provider]  Omega-3 Fatty Acids (FISH OIL) 1000 MG CAPS Take 1,000 mg daily by mouth.    Yes [provider]  pantoprazole (PROTONIX) 40 MG tablet Take 20 mg daily by mouth.    Yes [provider]  PARoxetine (PAXIL) 20 MG tablet Take 10 mg daily by mouth.    Yes [provider]  potassium chloride SA (K-DUR,KLOR-CON) 20 MEQ tablet Take 20 mEq  daily by mouth.   Yes [provider]  QUEtiapine (SEROQUEL) 25 MG tablet Take 25 mg at bedtime by mouth.   Yes [provider]  rivaroxaban (XARELTO) 20 MG TABS tablet Take 20 mg daily by mouth.   Yes [provider]  Zinc Oxide 6 % CREA Apply 1 application 2 (two) times daily topically.   Yes [provider]  phenytoin (DILANTIN) 100 MG ER capsule Take 1 capsule (100 mg total) by mouth daily. Patient not taking: Reported on 06/30/2017 02/07/15   Dellia Nims, MD   No Known Allergies  FAMILY HISTORY:  family history is not on file. SOCIAL HISTORY:  reports that  has never smoked. She does not have any smokeless tobacco history on file. She reports that she does not drink alcohol.  REVIEW OF SYSTEMS:  Unable to obtain due to pt's medical condition Constitutional: Negative for fever, chills, weight loss, malaise/fatigue and diaphoresis.  HENT: Negative for hearing loss, ear pain, nosebleeds, congestion, sore throat, neck pain, tinnitus and ear discharge.   Eyes: Negative for blurred vision, double vision, photophobia, pain, discharge and redness.  Respiratory: Negative for cough, hemoptysis, sputum production, shortness of breath, wheezing and stridor.   Cardiovascular: Negative for chest pain, palpitations, orthopnea, claudication, leg swelling and PND.  Gastrointestinal: Negative for heartburn, nausea, vomiting, abdominal pain, diarrhea, constipation, blood in stool and melena.  Genitourinary: Negative for dysuria, urgency, frequency, hematuria and flank pain.  Musculoskeletal: Negative for myalgias, back pain, joint pain and falls.  Skin: Negative for itching and rash.  Neurological: Negative for dizziness, tingling, tremors, sensory change, speech change, focal weakness, seizures, loss of consciousness, weakness and headaches.  Endo/Heme/Allergies: Negative for environmental allergies and polydipsia. Does not bruise/bleed easily.  SUBJECTIVE:   VITAL SIGNS: Temp:  [95.1 F (35.1 C)-97.3 F (36.3 C)] 95.1 F (35.1 C) (11/12 0039) Pulse Rate:  [86-95] 92 (11/12 0227) Resp:  [20-33] 25 (11/12 0146) BP: (61-128)/(25-98) 65/31 (11/12 0240) SpO2:  [91 %-99 %] 99 % (11/12 0227) Weight:  [158.8 kg (350 lb)] 158.8 kg (350 lb) (11/12 0025)  PHYSICAL EXAMINATION: General:   Obese female diffuse edema Neuro: awake and alert follows commands  HEENT:  Short neck thick, reactive pupils bilaterally  Cardiovascular:  S1 and S2 appreciated no rubs or gallops Lungs:  Coarse breath sounds bilateral ( upper airway radiation) Abdomen: soft obese abdomen with striae Musculoskeletal:  Diffuse swelling + 3 edema  Skin: erythema in skin folds, evidence of break down between posterior thighs and along gluteal fold.  Recent Labs  Lab 06/30/17 0013  NA 135  K 3.5  CL 103  CO2 18*  BUN <5*  CREATININE 3.17*  GLUCOSE 159*   Recent Labs  Lab 06/30/17 0013  HGB 8.4*  HCT 27.7*  WBC 12.4*  PLT 245   Dg Chest Port 1 View  Result Date: 06/30/2017 CLINICAL DATA:  Altered mental status.  Possible sepsis. EXAM: PORTABLE CHEST 1 VIEW COMPARISON:  None. FINDINGS: Low inspiratory examination with crowded vascular markings. Cardiac silhouette is mildly enlarged. RIGHT perihilar airspace opacity. No pleural effusion. No pneumothorax. Large body habitus. Osseous structures are nonacute. IMPRESSION: RIGHT perihilar consolidation concerning for pneumonia. Followup PA and lateral chest X-ray is recommended in 3-4 weeks following trial of antibiotic therapy to ensure resolution and exclude underlying malignancy. Mild cardiomegaly. Electronically Signed   By: Elon Alas M.D.   On: 06/30/2017 00:53    ASSESSMENT / PLAN: NEURO: 1. H/o Rathke's neoplasm 2. Ventriculostomy shunt catheters  3. Encephalomacia of the Right frontal lobe 4. Partially Calcified sellar mass- Treated craniopharyngioma 5. Seizure disorder Plan:  Phenytoin levels and Valproic both subtherapeutic keppra 250mg  BID,  Will load pt with AED GCS 13 Not on sedation  Will start prn analgesia  for severe pain If any change in mental status will consider CTH at that time  CARDIAC: Presumed Septic Shock Started on peripheral Neopsynephrine ggt Will place CVC catheter. Titrate Vasopressors for MAP goal  >70mmHg Given h/o adrenocorticoinsufficiency started on stress dose steroids. Hydrocort 100mg  Q 8 IV Will need a 2D ECHO to assess LVEF  PULMONARY: RIGHT perihilar consolidation concerning for pneumonia ABG pending to assess acid base balance Protecting airway however high aspiration risk Aspiration precautions' HOB>30 degrees  Supplemental O2 to keep Sats >92% Will continue on Ceftriaxone and Azithromycin pending cx, and PCT   ID: Urinary Tract Infection Right Perihilar consolidation concerning for PNA snet blood cx x2 Sputum cx Continue Ceftriaxone and Azithromycin IV PCT  Trend WBC, fever curve and Lactic  Endocrine: Panhypopituitarism Previously on Desmopressin 0.1mg  daily Synthroid 71mcg daily Started on Stress dose steroids 100mg  Q 8 IV ICU Glycemic protocol goal BG 140-180mg /dl TSH levels and T4  GI: Protein insufficiency Low Albumin noted NPO Place NGT start high protein TF On PPI as an outpatient Prophylaxis indicated- on steroids   Heme: Previously on Xarelto 20mg  daily due to VTE h/o Check anti Xa levels and resume when stable Hemoglobin 8.4 Platelets 245  If Hgb<7 transfuse PRBCs No signs of Any active bleeding DVT PPx-> Hep [place] and SCDs  RENAL Acute Kidney Injury Elevated from baseline 1.9 to 3.17 May be secondary to hypoperfusion Pt has received 3.5 L AGMA- secondary to lactic acidosis Trend LA Indwelling foley with temp ordered.. Lab Results  Component Value Date   CREATININE 3.17 (H) 06/30/2017   CREATININE 1.96 (H) 02/07/2015   CREATININE 1.89 (H) 02/06/2015  correct electrolyte imbalances K+ 4, Mg 2, Phos 2   I, Dr Seward Carol have personally reviewed patient's available data, including medical history, events of note, physical examination and test results as part of my evaluation. I have discussed with NPand other care providers such as pharmacist, RN and E-link. The patient is critically ill with multiple organ systems failure  and requires high complexity decision making for assessment and support, frequent evaluation and titration of therapies, application of advanced monitoring technologies and extensive interpretation of multiple databases. Critical Care Time devoted to patient care services described in this note is 15 Minutes. This time reflects time of care of this signee Dr Seward Carol. This critical care time does not reflect procedure time, or teaching time or supervisory time of NP but could involve care discussion time    DISPOSITION: ICU CC TIME: 55 mins PROGNOSIS: Guarded CODE STATUS: Full FAMILY: Primary Emergency Contact: [place] Phone: [phone] Mobile Phone: [phone] Relation: Brother   Signed Dr Seward Carol Pulmonary Critical Care Locums Pulmonary and [place] Pager: [phone]  06/30/2017, 3:00 AM

## 2017-06-30 NOTE — Progress Notes (Signed)
[place] Progress Note Patient Name: Margaret Morgan DOB: 1980-10-01 MRN: [id number]   Date of Service  06/30/2017  HPI/Events of Note  Lactic acid = 4.61 --> 4.7.   eICU Interventions  Will order: 1. Bolus with 0.9 NaCl 1 liter IV over 1 hour now.  2. Monitor CVP.     Intervention Category Major Interventions: Acid-Base disturbance - evaluation and management  [name],Steven Eugene 06/30/2017, 5:47 AM

## 2017-06-30 NOTE — Progress Notes (Signed)
Initial Nutrition Assessment  DOCUMENTATION CODES:   Morbid obesity(d/t fluid overload)  INTERVENTION:   Will continue to monitor for plan of care and diet advancement.  TF recommendations, if warranted: -Initiate Vital AF 1.2 @ 20 ml/hr. Once able to advance, increase by 10 ml every 4 hours to goal rate of 45 ml/hr.  NUTRITION DIAGNOSIS:   Inadequate oral intake related to inability to eat as evidenced by NPO status.  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 PCCM consulted for admission  Patient in room with RN at bedside. RD noted in MD notes that TF was to be started in patient. RD was not consulted for TF management.  Per RN, pt will not let her perform mouth care. Anticipates it will be difficult to place NGT. Pt was made DNR after MD conversation with pt's brother with no plans for intubation.  RD will continue to monitor for plan and GOC. If TF is to be initiated, please consult RD for management. Recommendations provided above.  Pt's weight is +163 lb since 2016. No recent weights recorded in chart. Pt is severely edematous.   Medications reviewed. Labs reviewed:  GFR: 22  NUTRITION - FOCUSED PHYSICAL EXAM:  Unable to accurately perform NFPE given severe edema.  Diet Order:  Diet NPO time specified  EDUCATION NEEDS:   No education needs have been identified at this time  Skin:  Skin Assessment: Skin Integrity Issues: Skin Integrity Issues:: Other (Comment) Other: non-pressure wound on thigh  Last BM:  PTA  Height:   Ht Readings from Last 1 Encounters:  06/30/17 5\' 2"  (1.575 m)    Weight:   Wt Readings from Last 1 Encounters:  06/30/17 (!) 348 lb  1.7 oz (157.9 kg)    Ideal Body Weight:  50 kg  BMI:  Body mass index is 63.67 kg/m.  Estimated Nutritional Needs:   Kcal:  1300-1500  Protein:  60-70g  Fluid:  per MD  Clayton Bibles, MS, RD, LDN [place] Dietitian Pager: [phone] After Hours Pager: [phone]

## 2017-06-30 NOTE — Clinical Social Work Note (Signed)
Clinical Social Work Assessment  Patient Details  Name: Margaret Morgan MRN: [id number] Date of Birth: 12-Aug-1981  Date of referral:  06/30/17               Reason for consult:  Facility Placement                Permission sought to share information with:  Family Supports Permission granted to share information::     Name::        Agency::  SNF  Relationship::  Brother   Contact Information:     Housing/Transportation Living arrangements for the past 2 months:  [place] of Information:  Other (Comment Required)(Brother) Patient Interpreter Needed:  None Criminal Activity/Legal Involvement Pertinent to Current Situation/Hospitalization:  No - Comment as needed Significant Relationships:  None Lives with:  Facility Resident Do you feel safe going back to the place where you live?  Yes Need for family participation in patient care:  Yes (Comment)  Care giving concerns:  Patient chief complaint-Hypotenison. Patient is a resident at [facility] and will return post acute care. The patient brother states he did not be hold at facility and understands if SNF bed is unavailble at discharge the patient/family will need to choose another facility. He reports the patient was living at [facility] for five years then moved to [facility] and rehab, then to [facility] and now [facility] care.  He reports the patient health has been declining and wants her to be close to help with medical decisions.   Social Worker assessment / plan:  CSW soke with patient brother about patient plan at discharge. He reports the patient will return to SNF. He is main contact for the patient. He reports the patient is alert and oriented at baseline. She can   Employment status:  Disabled (Comment on whether or not currently receiving Disability) Insurance information:  Managed Medicare PT Recommendations:  [place] / Referral to community  resources:  [place]  Patient/Family's Response to care: Unable to assess patient at this time. Family appreciative of CSW services.   Patient/Family's Understanding of and Emotional Response to Diagnosis, Current Treatment, and Prognosis:  Patient has MR, therefore brother is primary contact. "I am her main contact, the staff have kept me updated her care."  Emotional Assessment Appearance:  Appears stated age Attitude/Demeanor/Rapport:  Unable to Assess Affect (typically observed):    Orientation:    Alcohol / Substance use:  Not Applicable Psych involvement (Current and /or in the community):  No (Comment)  Discharge Needs  Concerns to be addressed:  Discharge Planning Concerns Readmission within the last 30 days:  No Current discharge risk:  Cognitively Impaired Barriers to Discharge:  Continued Medical Work up   [employer], LCSW 06/30/2017, 2:41 PM

## 2017-06-30 NOTE — Progress Notes (Addendum)
RN notified Dr. Oletta Darter of inability to flush or run IV infusion through CVC due to line being kinked at sutured sites. RN attempted to redress line, but line must be manipulated to allow flushing and blood pull back. MD made aware of Levophed gtt rate and that Levophed was currently being given through a PIV, due to issue with CVC. MD also made aware of pt current BP. Will continue to monitor.

## 2017-07-01 ENCOUNTER — Inpatient Hospital Stay (HOSPITAL_COMMUNITY): Payer: Medicare Other

## 2017-07-01 DIAGNOSIS — R579 Shock, unspecified: Secondary | ICD-10-CM

## 2017-07-01 DIAGNOSIS — J9601 Acute respiratory failure with hypoxia: Secondary | ICD-10-CM

## 2017-07-01 LAB — GLUCOSE, CAPILLARY
GLUCOSE-CAPILLARY: 289 mg/dL — AB (ref 65–99)
GLUCOSE-CAPILLARY: 249 mg/dL — AB (ref 65–99)
GLUCOSE-CAPILLARY: 278 mg/dL — AB (ref 65–99)
Glucose-Capillary: 247 mg/dL — ABNORMAL HIGH (ref 65–99)

## 2017-07-01 LAB — BLOOD GAS, ARTERIAL
ACID-BASE DEFICIT: 13.3 mmol/L — AB (ref 0.0–2.0)
Acid-base deficit: 14.2 mmol/L — ABNORMAL HIGH (ref 0.0–2.0)
Bicarbonate: 12.5 mmol/L — ABNORMAL LOW (ref 20.0–28.0)
Bicarbonate: 13 mmol/L — ABNORMAL LOW (ref 20.0–28.0)
DRAWN BY: 270211
Drawn by: 308601
FIO2: 0.21
O2 CONTENT: 15 L/min
O2 Saturation: 95 %
O2 Saturation: 72.4 %
PCO2 ART: 29.8 mmHg — AB (ref 32.0–48.0)
PCO2 ART: 39.8 mmHg (ref 32.0–48.0)
PH ART: 7.248 — AB (ref 7.350–7.450)
PO2 ART: 52.8 mmHg — AB (ref 83.0–108.0)
Patient temperature: 98.6
Patient temperature: 101.5
pH, Arterial: 7.151 — CL (ref 7.350–7.450)
pO2, Arterial: 83.9 mmHg (ref 83.0–108.0)

## 2017-07-01 LAB — COMPREHENSIVE METABOLIC PANEL
ALT: 26 U/L (ref 14–54)
AST: 43 U/L — AB (ref 15–41)
Albumin: 1.9 g/dL — ABNORMAL LOW (ref 3.5–5.0)
Alkaline Phosphatase: 73 U/L (ref 38–126)
Anion gap: 13 (ref 5–15)
BUN: 5 mg/dL — AB (ref 6–20)
CHLORIDE: 109 mmol/L (ref 101–111)
CO2: 15 mmol/L — AB (ref 22–32)
CREATININE: 2.8 mg/dL — AB (ref 0.44–1.00)
Calcium: 7.8 mg/dL — ABNORMAL LOW (ref 8.9–10.3)
GFR calc non Af Amer: 20 mL/min — ABNORMAL LOW (ref >60)
GFR, EST AFRICAN AMERICAN: 24 mL/min — AB (ref >60)
Glucose, Bld: 240 mg/dL — ABNORMAL HIGH (ref 65–99)
POTASSIUM: 3.9 mmol/L (ref 3.5–5.1)
SODIUM: 137 mmol/L (ref 135–145)
Total Bilirubin: 1.1 mg/dL (ref 0.3–1.2)
Total Protein: 4.8 g/dL — ABNORMAL LOW (ref 6.5–8.1)

## 2017-07-01 LAB — BASIC METABOLIC PANEL
Anion gap: 16 — ABNORMAL HIGH (ref 5–15)
CALCIUM: 8.1 mg/dL — AB (ref 8.9–10.3)
CHLORIDE: 106 mmol/L (ref 101–111)
CO2: 15 mmol/L — AB (ref 22–32)
CREATININE: 2.87 mg/dL — AB (ref 0.44–1.00)
GFR calc Af Amer: 23 mL/min — ABNORMAL LOW (ref >60)
GFR calc non Af Amer: 20 mL/min — ABNORMAL LOW (ref >60)
Glucose, Bld: 280 mg/dL — ABNORMAL HIGH (ref 65–99)
Potassium: 3.9 mmol/L (ref 3.5–5.1)
SODIUM: 137 mmol/L (ref 135–145)

## 2017-07-01 LAB — MAGNESIUM
MAGNESIUM: 1.6 mg/dL — AB (ref 1.7–2.4)
MAGNESIUM: 1.6 mg/dL — AB (ref 1.7–2.4)

## 2017-07-01 LAB — PHOSPHORUS
PHOSPHORUS: 4.5 mg/dL (ref 2.5–4.6)
PHOSPHORUS: 4.4 mg/dL (ref 2.5–4.6)

## 2017-07-01 LAB — PROCALCITONIN: Procalcitonin: 1.72 ng/mL

## 2017-07-01 MED ORDER — VITAL HIGH PROTEIN PO LIQD
1000.0000 mL | ORAL | Status: DC
  Filled 2017-07-01: qty 1000

## 2017-07-01 MED ORDER — VITAL 1.5 CAL PO LIQD
1000.0000 mL | ORAL | Status: DC
  Administered 2017-07-01: 1000 mL
  Filled 2017-07-01: qty 1000

## 2017-07-01 MED ORDER — LACTULOSE 10 GM/15ML PO SOLN
30.0000 g | Freq: Two times a day (BID) | ORAL | Status: DC
  Administered 2017-07-01: 30 g
  Filled 2017-07-01: qty 45

## 2017-07-01 MED ORDER — SODIUM CHLORIDE 0.9 % IV BOLUS (SEPSIS)
1000.0000 mL | Freq: Once | INTRAVENOUS | Status: AC
  Administered 2017-07-01: 1000 mL via INTRAVENOUS

## 2017-07-01 MED ORDER — SODIUM BICARBONATE 8.4 % IV SOLN
INTRAVENOUS | Status: DC
  Administered 2017-07-01: 14:00:00 via INTRAVENOUS
  Filled 2017-07-01 (×3): qty 150

## 2017-07-01 MED ORDER — SODIUM CHLORIDE 0.9 % IV SOLN
0.0000 ug/min | INTRAVENOUS | Status: DC
  Administered 2017-07-01: 310 ug/min via INTRAVENOUS
  Administered 2017-07-01: 170 ug/min via INTRAVENOUS
  Administered 2017-07-01 (×2): 340 ug/min via INTRAVENOUS
  Filled 2017-07-01 (×4): qty 4

## 2017-07-01 MED ORDER — INSULIN ASPART 100 UNIT/ML ~~LOC~~ SOLN
0.0000 [IU] | SUBCUTANEOUS | Status: DC
  Administered 2017-07-01 (×3): 8 [IU] via SUBCUTANEOUS
  Administered 2017-07-01 (×2): 5 [IU] via SUBCUTANEOUS

## 2017-07-01 MED ORDER — PHENYLEPHRINE HCL-NACL 10-0.9 MG/250ML-% IV SOLN
0.0000 ug/min | INTRAVENOUS | Status: DC
  Administered 2017-07-01: 20 ug/min via INTRAVENOUS
  Administered 2017-07-01: 170 ug/min via INTRAVENOUS
  Filled 2017-07-01 (×2): qty 250

## 2017-07-01 MED ORDER — INSULIN ASPART 100 UNIT/ML ~~LOC~~ SOLN
5.0000 [IU] | SUBCUTANEOUS | Status: DC
  Administered 2017-07-01 (×3): 5 [IU] via SUBCUTANEOUS

## 2017-07-01 MED ORDER — ACETAMINOPHEN 160 MG/5ML PO SOLN
650.0000 mg | Freq: Four times a day (QID) | ORAL | Status: DC | PRN
  Administered 2017-07-01: 650 mg
  Filled 2017-07-01: qty 20.3

## 2017-07-01 MED ORDER — PHENYLEPHRINE HCL-NACL 10-0.9 MG/250ML-% IV SOLN
INTRAVENOUS | Status: AC
  Filled 2017-07-01: qty 250

## 2017-07-01 MED ORDER — SODIUM BICARBONATE 8.4 % IV SOLN: 100.0000 meq | Freq: Once | INTRAVENOUS | Status: DC

## 2017-07-01 MED ORDER — ACETAMINOPHEN 650 MG RE SUPP
650.0000 mg | Freq: Four times a day (QID) | RECTAL | Status: DC | PRN
  Administered 2017-07-01: 650 mg via RECTAL
  Filled 2017-07-01: qty 1

## 2017-07-01 MED ORDER — CHLORHEXIDINE GLUCONATE CLOTH 2 % EX PADS
6.0000 | MEDICATED_PAD | Freq: Every day | CUTANEOUS | Status: DC
  Administered 2017-07-01: 6 via TOPICAL

## 2017-07-01 MED ORDER — SODIUM CHLORIDE 0.9% FLUSH: 10.0000 mL | INTRAVENOUS | Status: DC | PRN

## 2017-07-01 MED ORDER — PRO-STAT SUGAR FREE PO LIQD
30.0000 mL | Freq: Two times a day (BID) | ORAL | Status: DC
  Administered 2017-07-01: 30 mL
  Filled 2017-07-01: qty 30

## 2017-07-01 MED ORDER — PRO-STAT SUGAR FREE PO LIQD: 30.0000 mL | Freq: Every day | ORAL | Status: DC

## 2017-07-03 LAB — URINE CULTURE: Culture: >=100000 — AB

## 2017-07-05 LAB — CULTURE, BLOOD (ROUTINE X 2)
CULTURE: NO GROWTH
CULTURE: NO GROWTH
SPECIAL REQUESTS: ADEQUATE
Special Requests: ADEQUATE

## 2017-07-07 ENCOUNTER — Telehealth: Payer: Self-pay

## 2017-07-07 NOTE — Telephone Encounter (Signed)
On 07/07/2017 I received a d/c from [place] (original). The d/c is for burial. The patient is a patient of Doctor Nelda Marseille. The d/c will be taken to [facility] ([facility]) this pm for signature.  On 07/08/17 I received the d/c back from Doctor Nelda Marseille. I got the d/c ready and called the funeral home to let them know the d/c is ready for pickup.

## 2017-07-19 NOTE — Progress Notes (Signed)
Nutrition Follow-up  DOCUMENTATION CODES:   Morbid obesity(d/t fluid overload)  INTERVENTION:  - Will order 30 mL Prostat with Vital 1.5 @ 25 mL/hr to increase by 10 mL every 8 hours to reach goal rate of Vital 1.5 @ 45 mL/hr. At goal rate, this regimen will provide 1705 kcal, 88 grams of protein, and 825 mL free water.  - Will continue to monitor medical course and fluid status closely.   NUTRITION DIAGNOSIS:   Inadequate oral intake related to inability to eat as evidenced by NPO status. -ongoing  GOAL:   Patient will meet greater than or equal to 90% of their needs -unmet/unable to meet at this time.   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 PCCM consulted for admission  11/13 Pt is DNR/DNI NGT placed earlier this AM and x-ray confirms placement in the proximal gastric body. Per PCCM MD note this AM, pt with diffuse +4 anasarca with weeping. Per chart review, current weight is +16 lbs/7.2 kg from yesterday; likely d/t anasarca + high rate IVF. Continue to use weight from yesterday to estimate nutrition needs. Kcal needs calculated yesterday remain appropriate with current medical course, but adjusted estimated protein need based on the same and age. TF order outlined above; slow advancement given medical course, severe anasarca, and unknown PO intakes PTA.   Medications reviewed; 100 mg Solu-cortef TID, sliding scale Novolog, 5 units Novolog every 4 hours, 30 g lactulose BID per NGT, 50 mcg IV Synthroid/day, 40 mg IV Protonix/day.  Labs reviewed; CBG: 289 and 249 mg/dL this AM, BUN: <5 mg/dL, creatinine: 2.62 mg/dL, Ca: 7.7 mg/dL, GFR: 22 mL/min.  IVF: NS @ 125 mL/hr.    11/12 - Patient in room with RN at bedside.  - RD noted  in MD notes that TF was to be started in patient.  - RD was not consulted for TF management.  - Per RN, pt will not let her perform mouth care.  - Anticipates it will be difficult to place NGT.  - Pt was made DNR after MD conversation with pt's brother with no plans for intubation. - Pt's weight is +163 lb since 2016. No recent weights recorded in chart.  - Pt is severely edematous.       NUTRITION - FOCUSED PHYSICAL EXAM: Continue to monitor for ability to perform/assess   Diet Order:  Diet NPO time specified  EDUCATION NEEDS:   No education needs have been identified at this time  Skin:  Skin Assessment: Skin Integrity Issues: Skin Integrity Issues:: Other (Comment) Other: non-pressure wound on thigh  Last BM:  PTA  Height:   Ht Readings from Last 1 Encounters:  06/30/17 5\' 2"  (1.575 m)    Weight:   Wt Readings from Last 1 Encounters:  2017-07-25 (!) 364 lb (165.1 kg)    Ideal Body Weight:  50 kg  BMI:  Body mass index is 66.58 kg/m.  Estimated Nutritional Needs:   Kcal:  1700-1900  Protein:  80-95 grams  Fluid:  per MD      Jarome Matin, MS, RD, LDN, CNSC Inpatient Clinical Dietitian Pager # [phone] After hours/weekend pager # [phone]

## 2017-07-19 NOTE — Progress Notes (Signed)
[facility] Progress Note Patient Name: Margaret Morgan DOB: 02-13-1981 MRN: [id number]   Date of Service  07/31/2017  HPI/Events of Note  Blood glucose = 289 - Patient is currently on standard Novolog SII coverage.   eICU Interventions  Will order: 1. Increase coverage to Q 4 hour moderate Novolog SSI.      Intervention Category Major Interventions: Hyperglycemia - active titration of insulin therapy  Lysle Dingwall 07/31/17, 3:34 AM

## 2017-07-19 NOTE — Progress Notes (Addendum)
[name] Progress Note Patient Name: Margaret Morgan DOB: Dec 24, 1980 MRN: [id number]   Date of Service  07-22-2017  HPI/Events of Note  Wide complex tachycardia on bedside monitor - Ventricular rate = 152. BP = 87/59 with MAP = 67. Patient is DNR/DNI.  eICU Interventions  Will order: 1. ABG and BMP STAT.      Intervention Category Major Interventions: Arrhythmia - evaluation and management  [name],Steven Eugene 07-22-17, 9:13 PM

## 2017-07-19 NOTE — Progress Notes (Signed)
Patient time of death is 2205. Patient asystole and heart sounds were assessed and verified by Gilmer Mor, RN, [place], RN, and [place], RN. Family and MD aware. Family will arrive soon to view body.

## 2017-07-19 NOTE — Progress Notes (Signed)
.. ..  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 PCCM consulted for admission  SIGNIFICANT EVENTS  Hypotension despite fluids  STUDIES:  Echocardiogram pending   SUBJECTIVE:  Remains in shock, mental status worse  VITAL SIGNS: Temp:  [98.4 F (36.9 C)-100.9 F (38.3 C)] 100.9 F (38.3 C) (11/13 0800) Pulse Rate:  [109-152] 152 (11/13 0800) Resp:  [17-36] 25 (11/13 0800) BP: (66-122)/(21-104) 110/73 (11/13 0800) SpO2:  [92 %-98 %] 94 % (11/13 0800) Weight:  [364 lb (165.1 kg)] 364 lb (165.1 kg) (11/13 0300) CVP:  [3 mmHg-9 mmHg] 7 mmHg   Intake/Output Summary (Last 24 hours) at 23-Jul-2017 9937 Last data filed at 2017/07/23 0800 Gross per 24 hour  Intake 5899.34 ml  Output 608 ml  Net 5291.34 ml    Physical exam   General: Chronically ill 36 year old female currently minimally responsive with episodes of apnea this morning HEENT: Mucous membranes remain dry and cracked, right IJ catheter unremarkable, foul-smelling breath Pupils equal and reactive Pulmonary: Labored paradoxical respiratory efforts diminished throughout Cardiac: Tachycardic on telemetry no murmur rub or gallop Abdomen: Very large, no tenderness, positive bowel sounds Extremities/musculoskeletal.  Diffuse weakness, diffuse 4+ anasarca, skin is weeping Neuro/psych: Opens eyes to noxious stimulus only, will not follow commands, less readily awakens today  Recent Labs  Lab 06/30/17 0013 06/30/17 1211  NA 135 135  K 3.5 4.0  CL 103 106  CO2 18* 15*  BUN <5* <5*  CREATININE 3.17* 2.62*  GLUCOSE 159* 255*   Recent Labs  Lab 06/30/17 0013  HGB 8.4*  HCT 27.7*  WBC 12.4*  PLT 245   Dg Chest Port  1 View  Result Date: 06/30/2017 CLINICAL DATA:  Central line placement. EXAM: PORTABLE CHEST 1 VIEW COMPARISON:  Chest radiograph June 30, 2017 at 0016 hours FINDINGS: RIGHT internal jugular central venous catheter distal tip projects in distal superior vena cava. No pneumothorax. Cardiac silhouette is mildly enlarged and unchanged. Pulmonary vascular congestion and perihilar airspace opacities. No pleural effusion. No pneumothorax. Large body habitus. Osseous structures are nonsuspicious. IMPRESSION: RIGHT internal jugular central venous catheter distal tip projects in distal superior vena cava. No pneumothorax. Mild cardiomegaly and pulmonary vascular congestion. Perihilar confluent edema, atelectasis or pneumonia. Electronically Signed   By: Elon Alas M.D.   On: 06/30/2017 05:21   Dg Chest Port 1 View  Result Date: 06/30/2017 CLINICAL DATA:  Altered mental status.  Possible sepsis. EXAM: PORTABLE CHEST 1 VIEW COMPARISON:  None. FINDINGS: Low inspiratory examination with crowded vascular markings. Cardiac silhouette is mildly enlarged. RIGHT perihilar airspace opacity. No pleural effusion. No pneumothorax. Large body habitus. Osseous structures are nonacute. IMPRESSION: RIGHT perihilar consolidation concerning for pneumonia. Followup PA and lateral chest X-ray is recommended in 3-4 weeks following trial of antibiotic therapy to ensure resolution and exclude underlying malignancy. Mild cardiomegaly. Electronically Signed   By: Elon Alas M.D.   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 Seizure disorder Plan:  Continuing anticonvulsants Continue supportive care Add lactulose   Refractory septic shock in setting of Urinary Tract Infection & Right  Perihilar consolidation concerning for PNA -Urine culture demonstrating  greater than 100,000 colonies of GNR -Blood cultures negative to date -Had worsening tachycardia on norepinephrine so switch to Neo-Synephrine Plan  Day #2 ceftriaxone and azithromycin Neo-Synephrine for goal mean arterial pressure greater than 65 Day #1 ceftriaxone and azithromycin Trend pro-calcitonin Central venous pressure goal 8-12 Continue stress dose steroids Continue telemetry monitoring  Acute hypoxic respiratory failure in the setting of pneumonia Body habitus also concerning for obstructive sleep apnea -Her mental status will not support noninvasive positive pressure ventilation -After speaking to her brother [name] on 11/13 we did establish she would be a DO NOT RESUSCITATE and DO NOT INTUBATE Plan Continue supplemental oxygen Would provide high flow oxygen if needed Repeat ABG  Acute Kidney Injury & AGMA- secondary to lactic acidosis Borderline hypokalemia Most recent serum creatinine continues to rise, acid-base worse.  Still awaiting morning lab work. Plan In the setting of UTI will need renal ultrasound KVO IV fluids; appears now volume overloaded Follow-up a.m. 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  On exam, patient is in refractory shock, skin very tight with third spacing.  I reviewed CXR myself, ETT is in good position.  Spoke with brother yesterday, limitations of care  established.  LCB with pressors only.  Patient is clearly deteriorating and not doing well.  I do not suspect she will survive this admission.  Continue abx.  Continue hemodynamic support.  Once labs return will communicate with brother again and recommend palliation at this time.  The patient is critically ill with multiple organ systems failure and requires high complexity decision making for assessment and support, frequent evaluation and titration of therapies, application of advanced monitoring technologies and extensive interpretation of multiple databases.   Critical Care Time devoted to patient care services described in this note is  35  Minutes. This time reflects time of care of this signee Dr Jennet Maduro. This critical care time does not reflect procedure time, or teaching time or supervisory time of PA/NP/Med student/Med Resident etc but could involve care discussion time.  Rush Farmer, M.D. [facility] Pulmonary/Critical Care Medicine. Pager: [phone]. After hours pager: [phone].  07-10-2017, 9:27 AM

## 2017-07-19 NOTE — Discharge Summary (Signed)
Margaret Morgan, Margaret Morgan                         ACCOUNT NO.:  MEDICAL RECORD NO.:  [id number]  LOCATION:                                 FACILITY:  PHYSICIAN:  Providence Lanius, MD  DATE OF BIRTH:  [birth year]  DATE OF ADMISSION: DATE OF DISCHARGE:                              DISCHARGE SUMMARY   DEATH SUMMARY  PRIMARY DIAGNOSIS/CAUSE OF DEATH:  Healthcare-acquired pneumonia.  SECONDARY DIAGNOSES: 1. Respiratory failure. 2. Mental retardation. 3. Encephalomalacia of the right frontal lobe with treated sellar     mass. 4. Rathke's neoplasm. 5. Seizure disorder. 6. Refractory septic shock. 7. Acute hypoxemic respiratory failure. 8. Morbid obesity. 9. Obstructive sleep apnea. 10.Acute kidney injury. 11.Lactic acidosis. 12.Hypokalemia. 13.Pan hypopituitarism. 14.Hyperglycemia. 15.Protein-calorie malnutrition. 16.History of venous thromboembolism, on chronic anticoagulation with     Xarelto.  HISTORY OF PRESENT ILLNESS/HOSPITAL COURSE:  The patient was a 36 year old female with past medical history of mental retardation and encephalomalacia, who was hospitalized for healthcare-acquired pneumonia from the nursing home.  She was found to become more encephalopathic and refractory shock.  We had a conversation with the brother, who is the DPOA the day prior to the patient's passing, and after discussion, the decision was made to make the patient without code blue, and no CPR and no cardioversion, but continue support.  Antibiotics were continued.  IV fluids were continued. However, on 05/31/2017 at 11 p.m., the patient was noted to have a wide- complex tachycardia, and subsequently became asystolic.  Given the fact that she was a 'do not resuscitate' status, no CPR was done, and this patient expired.  Family was notified.     Providence Lanius, MD     WJY/MEDQ  D:  07/03/2017  T:  07/04/2017  Job:  [id number]

## 2017-07-19 NOTE — Progress Notes (Signed)
Inpatient Diabetes Program Recommendations  AACE/ADA: New Consensus Statement on Inpatient Glycemic Control (2015)  Target Ranges:  Prepandial:   less than 140 mg/dL      Peak postprandial:   less than 180 mg/dL (1-2 hours)      Critically ill patients:  140 - 180 mg/dL   Results for Margaret Morgan, Margaret Morgan (MRN [id number]) as of 2017-07-28 08:29  Ref. Range 06/30/2017 04:40 06/30/2017 07:45 06/30/2017 11:20 06/30/2017 15:55 06/30/2017 19:45 06/30/2017 22:57 2017/07/28 03:26  Glucose-Capillary Latest Ref Range: 65 - 99 mg/dL 166 (H) 180 (H) 215 (H) 239 (H) 257 (H) 269 (H) 289 (H)    Admit: Panhypopituitarism presenting with hypotension suspected sepsis 2/2 to UTI   History: DM  SNF DM Meds: Levemir 16 units QHS    Novolog 2-10 units TID per SSI  Current DM Meds: Novolog Moderate Correction Scale/ SSI (0-15 units) Q4 hours       MD- Note patient getting Solucortef 100 mg Q8 hours.  CBGs >250 mg/dl since 8pm last night.  Please consider the following:  1. Start Levemir 16 units daily (SNF dose)  2. Increase Novolog SSI to Resistant scale (0-20 units) Q4 hours  3. May consider Novolog tube feed coverage as well once tube feeds initiated      --Will follow patient during hospitalization--  Wyn Quaker RN, MSN, CDE Diabetes Coordinator Inpatient Glycemic Control Team Team Pager: [phone] (8a-5p)

## 2017-07-19 NOTE — Progress Notes (Signed)
[facility] Progress Note Patient Name: Margaret Morgan DOB: [birth year] MRN: [id number]   Date of Service  07/03/2017  HPI/Events of Note  Multiple issues: 1. HR = 151. EKG read as sinus tachycardia - CVP = 5 and 2. Fever to 100.4 F. AST and ALT are both normal.   eICU Interventions  Will order: 1. Bolus with 0.9 NaCl 1 liter IV over 1 hour now.  2. Tylenol Suppository 650 mg PR Q 6 hours PRN Temp > 101.0 F.     Intervention Category Major Interventions: Arrhythmia - evaluation and management;Other:  [name],Steven Cornelia C[place] Jul 03, 2017, 7:01 AM

## 2017-07-19 DEATH — deceased

## 2019-05-01 IMAGING — DX DG CHEST 1V PORT
1 series · 1 of 1 positions shown · non-contrast
Comparison: Chest x-ray 06/30/2017.

CLINICAL DATA: 36-year-old female with history of pneumonia.
Followup study.

EXAM:
PORTABLE CHEST 1 VIEW

[chest ap]
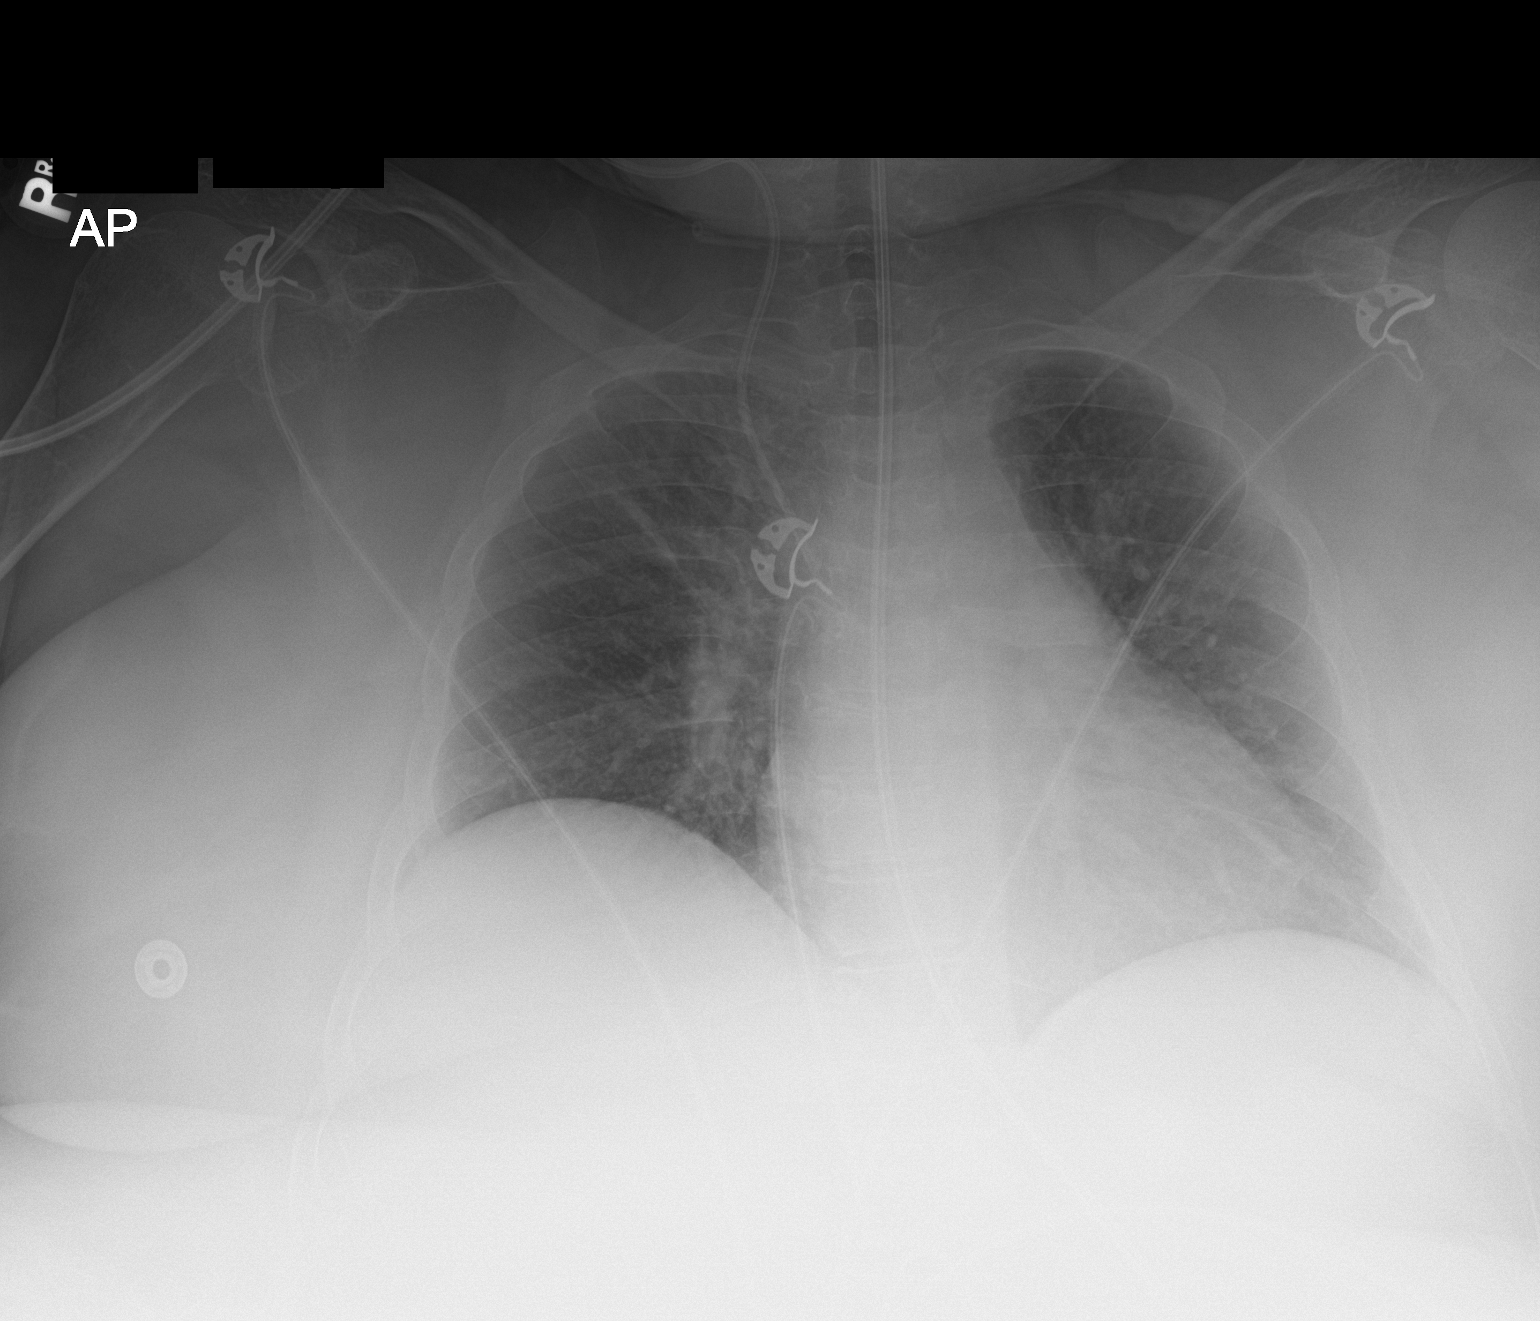

[1 of 1 positions shown; findings below may reference images not displayed]

FINDINGS: There is a right-sided internal jugular central venous catheter with
tip terminating in the mid superior vena cava. A feeding tube is
seen extending into the abdomen, however, the tip of the feeding
tube extends below the lower margin of the image. Lung volumes are
low. No consolidative airspace disease. No pleural effusions.
Cephalization of the pulmonary vasculature with slight
indistinctness of interstitial markings, favored to reflect a
background of mild interstitial pulmonary edema. Mild cardiomegaly.
Upper mediastinal contours are within normal limits.
IMPRESSION: 1. Support apparatus, as above.
2. Cardiomegaly with evidence of mild interstitial pulmonary edema
concerning for mild congestive heart failure. Overall, aeration has
slightly improved compared to yesterday's examination.
